# Patient Record
Sex: Male | Born: 1975 | Race: White | Hispanic: No | Marital: Single | Smoking: Never smoker
Health system: Southern US, Community
[De-identification: ages and names within clinical notes are randomized; demographics above are authoritative.]

## PROBLEM LIST (undated history)

## (undated) DIAGNOSIS — R6251 Failure to thrive (child): Secondary | ICD-10-CM

## (undated) DIAGNOSIS — G969 Disorder of central nervous system, unspecified: Secondary | ICD-10-CM

## (undated) DIAGNOSIS — M62838 Other muscle spasm: Secondary | ICD-10-CM

## (undated) DIAGNOSIS — I8390 Asymptomatic varicose veins of unspecified lower extremity: Secondary | ICD-10-CM

## (undated) DIAGNOSIS — G809 Cerebral palsy, unspecified: Secondary | ICD-10-CM

## (undated) DIAGNOSIS — K52832 Lymphocytic colitis: Secondary | ICD-10-CM

## (undated) DIAGNOSIS — G808 Other cerebral palsy: Secondary | ICD-10-CM

## (undated) DIAGNOSIS — K219 Gastro-esophageal reflux disease without esophagitis: Secondary | ICD-10-CM

## (undated) DIAGNOSIS — M2450 Contracture, unspecified joint: Secondary | ICD-10-CM

## (undated) DIAGNOSIS — R197 Diarrhea, unspecified: Secondary | ICD-10-CM

## (undated) HISTORY — DX: Lymphocytic colitis: K52.832

## (undated) HISTORY — DX: Gastro-esophageal reflux disease without esophagitis: K21.9

## (undated) HISTORY — DX: Cerebral palsy, unspecified: G80.9

## (undated) HISTORY — DX: Failure to thrive (child): R62.51

## (undated) HISTORY — DX: Asymptomatic varicose veins of unspecified lower extremity: I83.90

## (undated) HISTORY — DX: Disorder of central nervous system, unspecified: G96.9

## (undated) HISTORY — DX: Contracture, unspecified joint: M24.50

---

## 2004-04-15 HISTORY — PX: COLONOSCOPY: SHX5424

## 2004-12-21 ENCOUNTER — Encounter (INDEPENDENT_AMBULATORY_CARE_PROVIDER_SITE_OTHER): Payer: Self-pay | Admitting: Specialist

## 2004-12-21 ENCOUNTER — Ambulatory Visit (HOSPITAL_COMMUNITY): Admission: RE | Admit: 2004-12-21 | Discharge: 2004-12-21 | Payer: Self-pay | Admitting: Gastroenterology

## 2004-12-21 DIAGNOSIS — K52832 Lymphocytic colitis: Secondary | ICD-10-CM

## 2004-12-21 HISTORY — DX: Lymphocytic colitis: K52.832

## 2005-10-17 ENCOUNTER — Ambulatory Visit: Payer: Self-pay | Admitting: Family Medicine

## 2005-12-13 ENCOUNTER — Encounter (INDEPENDENT_AMBULATORY_CARE_PROVIDER_SITE_OTHER): Payer: Self-pay | Admitting: Family Medicine

## 2005-12-13 ENCOUNTER — Ambulatory Visit: Payer: Self-pay | Admitting: Family Medicine

## 2006-02-05 ENCOUNTER — Ambulatory Visit: Payer: Self-pay | Admitting: Family Medicine

## 2006-04-28 ENCOUNTER — Ambulatory Visit: Payer: Self-pay | Admitting: Family Medicine

## 2006-12-04 ENCOUNTER — Encounter (INDEPENDENT_AMBULATORY_CARE_PROVIDER_SITE_OTHER): Payer: Self-pay | Admitting: Family Medicine

## 2006-12-04 DIAGNOSIS — G809 Cerebral palsy, unspecified: Secondary | ICD-10-CM | POA: Insufficient documentation

## 2006-12-16 DIAGNOSIS — G8250 Quadriplegia, unspecified: Secondary | ICD-10-CM

## 2007-01-08 ENCOUNTER — Encounter (INDEPENDENT_AMBULATORY_CARE_PROVIDER_SITE_OTHER): Payer: Self-pay | Admitting: Family Medicine

## 2007-02-05 ENCOUNTER — Encounter (INDEPENDENT_AMBULATORY_CARE_PROVIDER_SITE_OTHER): Payer: Self-pay | Admitting: Family Medicine

## 2007-02-16 ENCOUNTER — Ambulatory Visit: Payer: Self-pay | Admitting: Family Medicine

## 2007-02-16 DIAGNOSIS — R636 Underweight: Secondary | ICD-10-CM | POA: Insufficient documentation

## 2007-02-16 LAB — CONVERTED CEMR LAB
ALT: 18 units/L (ref 0–53)
AST: 21 units/L (ref 0–37)
Albumin: 4.4 g/dL (ref 3.5–5.2)
Alkaline Phosphatase: 66 units/L (ref 39–117)
Basophils Absolute: 0 10*3/uL (ref 0.0–0.1)
Basophils Relative: 1 % (ref 0–1)
Calcium: 9.4 mg/dL (ref 8.4–10.5)
Chloride: 104 meq/L (ref 96–112)
Cholesterol: 128 mg/dL (ref 0–200)
Creatinine, Ser: 0.87 mg/dL (ref 0.40–1.50)
Eosinophils Absolute: 0.2 10*3/uL (ref 0.0–0.7)
Eosinophils Relative: 3 % (ref 0–5)
HDL: 38 mg/dL — ABNORMAL LOW (ref >39)
Hemoglobin: 14.5 g/dL (ref 13.0–17.0)
MCHC: 32.8 g/dL (ref 30.0–36.0)
Monocytes Absolute: 0.5 10*3/uL (ref 0.2–0.7)
Neutro Abs: 3.7 10*3/uL (ref 1.7–7.7)
RDW: 13.1 % (ref 11.5–14.0)
Total Protein: 7.4 g/dL (ref 6.0–8.3)
Triglycerides: 66 mg/dL (ref <150)

## 2007-03-09 ENCOUNTER — Encounter: Admission: RE | Admit: 2007-03-09 | Discharge: 2007-03-09 | Payer: Self-pay | Admitting: Family Medicine

## 2007-03-09 ENCOUNTER — Encounter (INDEPENDENT_AMBULATORY_CARE_PROVIDER_SITE_OTHER): Payer: Self-pay | Admitting: Family Medicine

## 2007-03-19 ENCOUNTER — Encounter (INDEPENDENT_AMBULATORY_CARE_PROVIDER_SITE_OTHER): Payer: Self-pay | Admitting: Family Medicine

## 2007-03-23 ENCOUNTER — Telehealth (INDEPENDENT_AMBULATORY_CARE_PROVIDER_SITE_OTHER): Payer: Self-pay | Admitting: *Deleted

## 2007-04-13 ENCOUNTER — Encounter (INDEPENDENT_AMBULATORY_CARE_PROVIDER_SITE_OTHER): Payer: Self-pay | Admitting: Family Medicine

## 2007-04-24 ENCOUNTER — Encounter (INDEPENDENT_AMBULATORY_CARE_PROVIDER_SITE_OTHER): Payer: Self-pay | Admitting: Family Medicine

## 2007-05-08 ENCOUNTER — Encounter (INDEPENDENT_AMBULATORY_CARE_PROVIDER_SITE_OTHER): Payer: Self-pay | Admitting: Family Medicine

## 2007-05-13 ENCOUNTER — Encounter (INDEPENDENT_AMBULATORY_CARE_PROVIDER_SITE_OTHER): Payer: Self-pay | Admitting: Family Medicine

## 2007-05-25 ENCOUNTER — Encounter (INDEPENDENT_AMBULATORY_CARE_PROVIDER_SITE_OTHER): Payer: Self-pay | Admitting: Family Medicine

## 2007-06-18 ENCOUNTER — Encounter (INDEPENDENT_AMBULATORY_CARE_PROVIDER_SITE_OTHER): Payer: Self-pay | Admitting: Family Medicine

## 2007-06-23 ENCOUNTER — Telehealth (INDEPENDENT_AMBULATORY_CARE_PROVIDER_SITE_OTHER): Payer: Self-pay | Admitting: Family Medicine

## 2007-07-07 ENCOUNTER — Encounter (INDEPENDENT_AMBULATORY_CARE_PROVIDER_SITE_OTHER): Payer: Self-pay | Admitting: Family Medicine

## 2007-07-17 ENCOUNTER — Encounter (INDEPENDENT_AMBULATORY_CARE_PROVIDER_SITE_OTHER): Payer: Self-pay | Admitting: Family Medicine

## 2007-08-11 ENCOUNTER — Encounter (INDEPENDENT_AMBULATORY_CARE_PROVIDER_SITE_OTHER): Payer: Self-pay | Admitting: Family Medicine

## 2007-08-26 ENCOUNTER — Encounter (INDEPENDENT_AMBULATORY_CARE_PROVIDER_SITE_OTHER): Payer: Self-pay | Admitting: Family Medicine

## 2007-09-11 ENCOUNTER — Encounter (INDEPENDENT_AMBULATORY_CARE_PROVIDER_SITE_OTHER): Payer: Self-pay | Admitting: Family Medicine

## 2007-09-17 ENCOUNTER — Encounter (INDEPENDENT_AMBULATORY_CARE_PROVIDER_SITE_OTHER): Payer: Self-pay | Admitting: Family Medicine

## 2007-09-17 ENCOUNTER — Telehealth (INDEPENDENT_AMBULATORY_CARE_PROVIDER_SITE_OTHER): Payer: Self-pay | Admitting: Family Medicine

## 2007-10-17 ENCOUNTER — Encounter (INDEPENDENT_AMBULATORY_CARE_PROVIDER_SITE_OTHER): Payer: Self-pay | Admitting: Family Medicine

## 2007-10-26 ENCOUNTER — Encounter (INDEPENDENT_AMBULATORY_CARE_PROVIDER_SITE_OTHER): Payer: Self-pay | Admitting: Family Medicine

## 2007-10-26 ENCOUNTER — Emergency Department (HOSPITAL_COMMUNITY): Admission: EM | Admit: 2007-10-26 | Discharge: 2007-10-26 | Payer: Self-pay | Admitting: Emergency Medicine

## 2007-10-26 ENCOUNTER — Telehealth (INDEPENDENT_AMBULATORY_CARE_PROVIDER_SITE_OTHER): Payer: Self-pay | Admitting: Family Medicine

## 2007-10-30 ENCOUNTER — Encounter (INDEPENDENT_AMBULATORY_CARE_PROVIDER_SITE_OTHER): Payer: Self-pay | Admitting: Internal Medicine

## 2007-11-05 ENCOUNTER — Encounter (INDEPENDENT_AMBULATORY_CARE_PROVIDER_SITE_OTHER): Payer: Self-pay | Admitting: Family Medicine

## 2007-11-09 ENCOUNTER — Telehealth (INDEPENDENT_AMBULATORY_CARE_PROVIDER_SITE_OTHER): Payer: Self-pay | Admitting: Family Medicine

## 2007-11-09 ENCOUNTER — Ambulatory Visit: Payer: Self-pay | Admitting: Family Medicine

## 2007-11-13 ENCOUNTER — Encounter (INDEPENDENT_AMBULATORY_CARE_PROVIDER_SITE_OTHER): Payer: Self-pay | Admitting: Family Medicine

## 2007-11-30 ENCOUNTER — Encounter: Admission: RE | Admit: 2007-11-30 | Discharge: 2008-01-13 | Payer: Self-pay | Admitting: Family Medicine

## 2007-12-06 ENCOUNTER — Emergency Department (HOSPITAL_COMMUNITY): Admission: EM | Admit: 2007-12-06 | Discharge: 2007-12-06 | Payer: Self-pay | Admitting: Emergency Medicine

## 2007-12-09 ENCOUNTER — Telehealth (INDEPENDENT_AMBULATORY_CARE_PROVIDER_SITE_OTHER): Payer: Self-pay | Admitting: *Deleted

## 2007-12-09 ENCOUNTER — Encounter (INDEPENDENT_AMBULATORY_CARE_PROVIDER_SITE_OTHER): Payer: Self-pay | Admitting: Family Medicine

## 2008-01-14 ENCOUNTER — Encounter (INDEPENDENT_AMBULATORY_CARE_PROVIDER_SITE_OTHER): Payer: Self-pay | Admitting: Family Medicine

## 2008-01-19 ENCOUNTER — Encounter (INDEPENDENT_AMBULATORY_CARE_PROVIDER_SITE_OTHER): Payer: Self-pay | Admitting: Family Medicine

## 2008-01-27 ENCOUNTER — Ambulatory Visit: Payer: Self-pay | Admitting: Family Medicine

## 2008-01-29 ENCOUNTER — Encounter (INDEPENDENT_AMBULATORY_CARE_PROVIDER_SITE_OTHER): Payer: Self-pay | Admitting: Family Medicine

## 2008-03-17 ENCOUNTER — Encounter (INDEPENDENT_AMBULATORY_CARE_PROVIDER_SITE_OTHER): Payer: Self-pay | Admitting: Family Medicine

## 2008-04-01 ENCOUNTER — Telehealth (INDEPENDENT_AMBULATORY_CARE_PROVIDER_SITE_OTHER): Payer: Self-pay | Admitting: *Deleted

## 2008-04-11 ENCOUNTER — Encounter (INDEPENDENT_AMBULATORY_CARE_PROVIDER_SITE_OTHER): Payer: Self-pay | Admitting: Family Medicine

## 2008-05-04 ENCOUNTER — Encounter (INDEPENDENT_AMBULATORY_CARE_PROVIDER_SITE_OTHER): Payer: Self-pay | Admitting: Family Medicine

## 2008-05-10 ENCOUNTER — Ambulatory Visit: Payer: Self-pay | Admitting: Family Medicine

## 2008-05-12 ENCOUNTER — Encounter (INDEPENDENT_AMBULATORY_CARE_PROVIDER_SITE_OTHER): Payer: Self-pay | Admitting: Family Medicine

## 2008-06-30 ENCOUNTER — Telehealth (INDEPENDENT_AMBULATORY_CARE_PROVIDER_SITE_OTHER): Payer: Self-pay | Admitting: *Deleted

## 2008-07-06 ENCOUNTER — Encounter (INDEPENDENT_AMBULATORY_CARE_PROVIDER_SITE_OTHER): Payer: Self-pay | Admitting: Family Medicine

## 2008-07-06 ENCOUNTER — Encounter (INDEPENDENT_AMBULATORY_CARE_PROVIDER_SITE_OTHER): Payer: Self-pay | Admitting: Nurse Practitioner

## 2008-07-13 ENCOUNTER — Encounter (INDEPENDENT_AMBULATORY_CARE_PROVIDER_SITE_OTHER): Payer: Self-pay | Admitting: Nurse Practitioner

## 2008-08-13 ENCOUNTER — Encounter (INDEPENDENT_AMBULATORY_CARE_PROVIDER_SITE_OTHER): Payer: Self-pay | Admitting: Family Medicine

## 2008-08-16 ENCOUNTER — Ambulatory Visit: Payer: Self-pay | Admitting: Family Medicine

## 2008-08-16 DIAGNOSIS — B029 Zoster without complications: Secondary | ICD-10-CM | POA: Insufficient documentation

## 2008-09-13 ENCOUNTER — Encounter (INDEPENDENT_AMBULATORY_CARE_PROVIDER_SITE_OTHER): Payer: Self-pay | Admitting: Family Medicine

## 2008-10-31 ENCOUNTER — Encounter (INDEPENDENT_AMBULATORY_CARE_PROVIDER_SITE_OTHER): Payer: Self-pay | Admitting: Internal Medicine

## 2008-11-07 ENCOUNTER — Encounter (INDEPENDENT_AMBULATORY_CARE_PROVIDER_SITE_OTHER): Payer: Self-pay | Admitting: Internal Medicine

## 2008-11-18 ENCOUNTER — Telehealth (INDEPENDENT_AMBULATORY_CARE_PROVIDER_SITE_OTHER): Payer: Self-pay | Admitting: Internal Medicine

## 2008-12-12 ENCOUNTER — Ambulatory Visit: Payer: Self-pay | Admitting: Family Medicine

## 2009-01-11 ENCOUNTER — Encounter: Payer: Self-pay | Admitting: Physician Assistant

## 2009-01-26 ENCOUNTER — Ambulatory Visit: Payer: Self-pay | Admitting: Nurse Practitioner

## 2009-03-31 ENCOUNTER — Ambulatory Visit: Payer: Self-pay | Admitting: Physician Assistant

## 2009-04-03 ENCOUNTER — Encounter: Payer: Self-pay | Admitting: Physician Assistant

## 2009-04-27 ENCOUNTER — Encounter: Payer: Self-pay | Admitting: Physician Assistant

## 2009-05-10 ENCOUNTER — Ambulatory Visit: Payer: Self-pay | Admitting: Physician Assistant

## 2009-05-10 ENCOUNTER — Telehealth: Payer: Self-pay | Admitting: Physician Assistant

## 2009-05-11 ENCOUNTER — Encounter: Payer: Self-pay | Admitting: Physician Assistant

## 2009-05-17 ENCOUNTER — Telehealth: Payer: Self-pay | Admitting: Physician Assistant

## 2009-05-19 ENCOUNTER — Encounter: Admission: RE | Admit: 2009-05-19 | Discharge: 2009-07-03 | Payer: Self-pay | Admitting: Physician Assistant

## 2009-05-19 ENCOUNTER — Encounter: Payer: Self-pay | Admitting: Physician Assistant

## 2009-06-05 ENCOUNTER — Encounter: Payer: Self-pay | Admitting: Physician Assistant

## 2009-07-03 ENCOUNTER — Encounter: Payer: Self-pay | Admitting: Physician Assistant

## 2009-07-03 ENCOUNTER — Telehealth: Payer: Self-pay | Admitting: Physician Assistant

## 2009-07-11 ENCOUNTER — Telehealth: Payer: Self-pay | Admitting: Physician Assistant

## 2009-07-13 ENCOUNTER — Encounter: Payer: Self-pay | Admitting: Physician Assistant

## 2009-07-21 ENCOUNTER — Telehealth: Payer: Self-pay | Admitting: Physician Assistant

## 2009-07-28 ENCOUNTER — Encounter: Payer: Self-pay | Admitting: Physician Assistant

## 2009-07-31 IMAGING — CR DG FOOT COMPLETE 3+V*L*
3 series · 3 of 3 positions shown · non-contrast
Comparison: None

CLINICAL DATA: Trauma.  Left great toe hurts.  Patient with
physical handicap.  Unable to hold legs still due to spasms.

LEFT FOOT - COMPLETE 3+ VIEW

[view not recorded (1 of 3)]
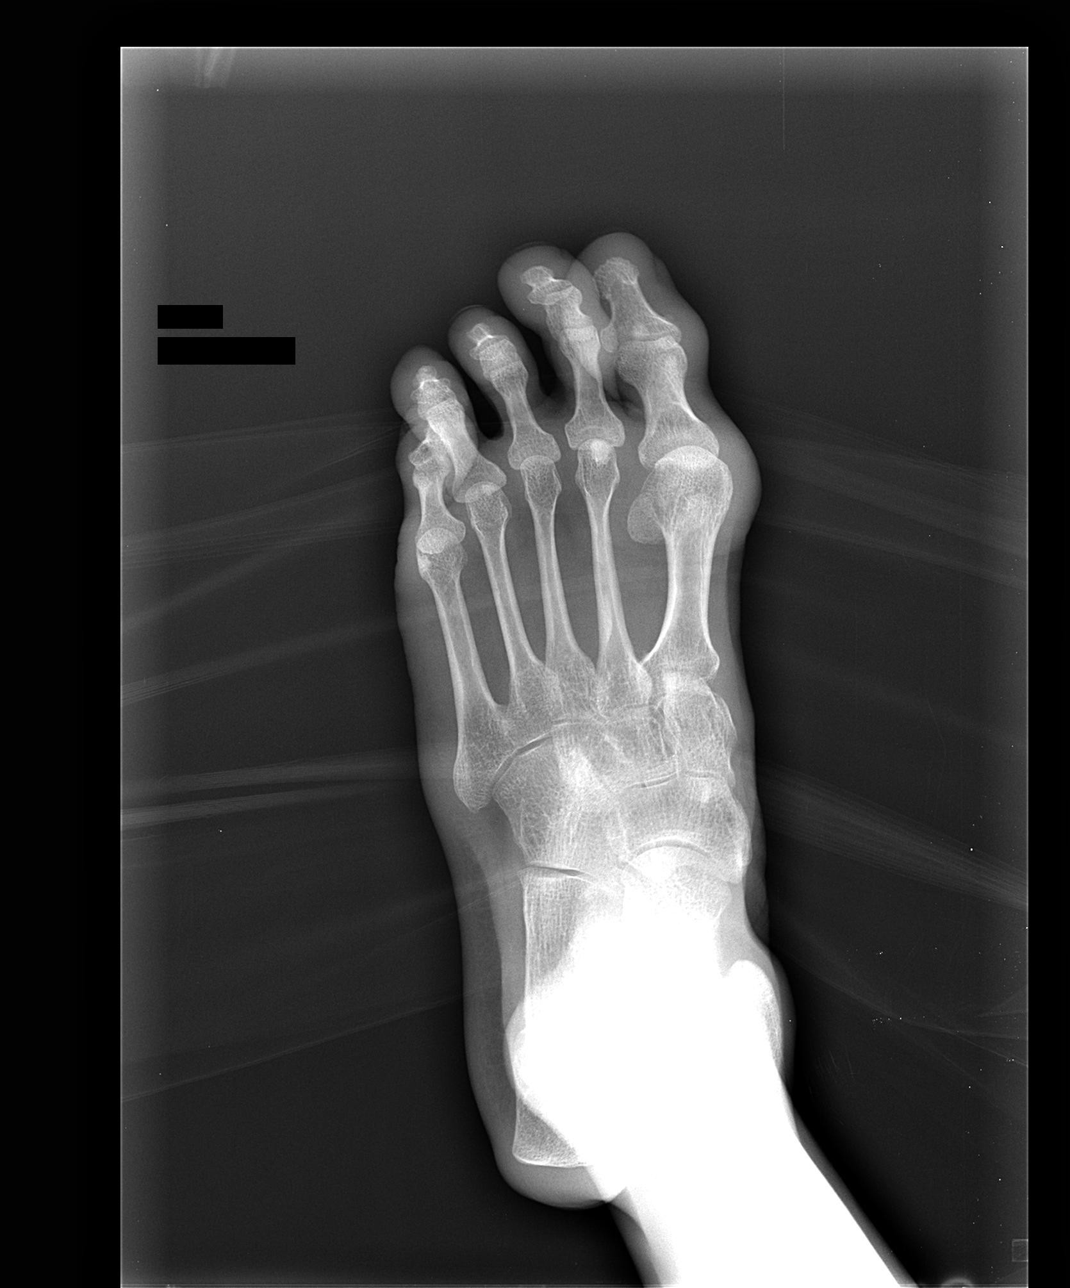

[view not recorded (2 of 3)]
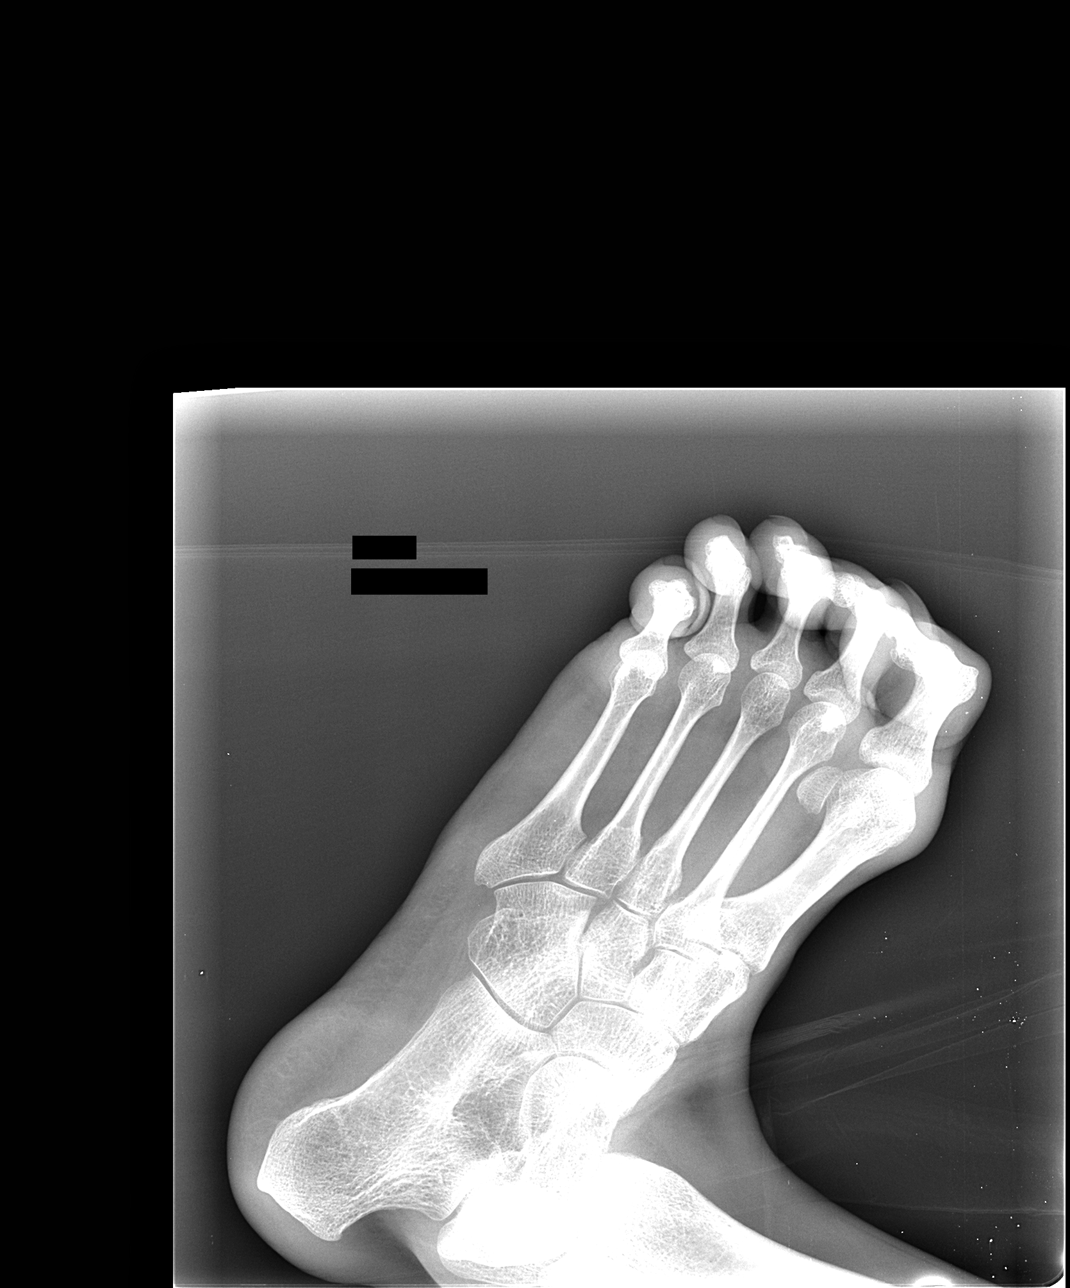

[view not recorded (3 of 3)]
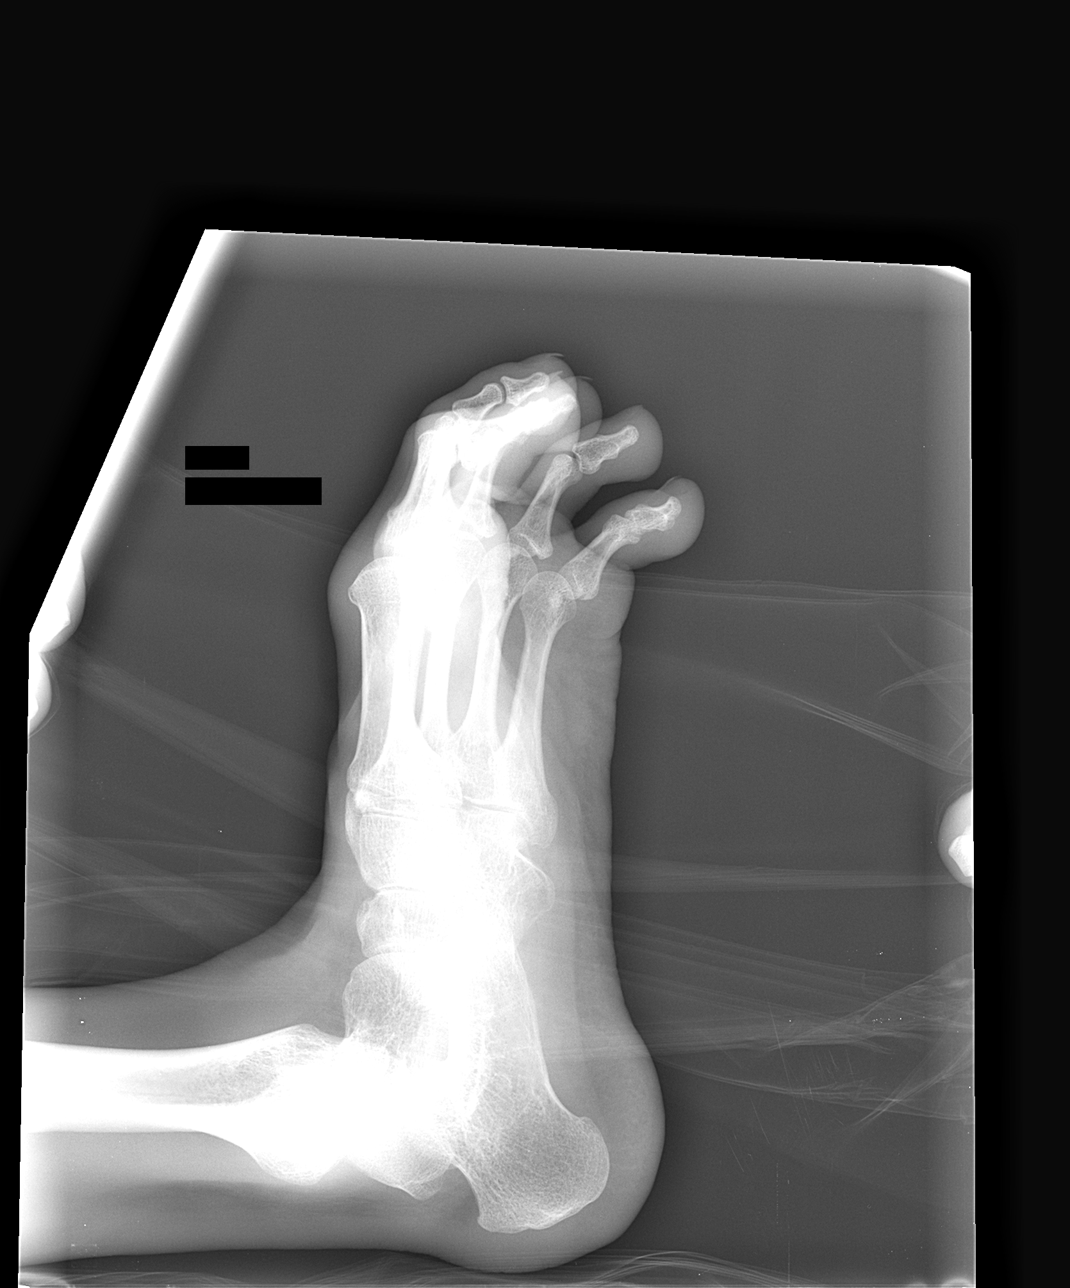

[3 of 3 positions shown; findings below may reference images not displayed]

FINDINGS: Exam is limited by suboptimal patient positioning.
Specifically,  the MTP joints are flexed.  There is a probable
hallux valgus deformity.  Mild osteopenia.  Probable pes planus
deformity.
IMPRESSION: 1.  Limited exam due to suboptimal patient positioning as
described.
2.  Given this limitation, no acute findings about the left foot.

## 2009-08-23 ENCOUNTER — Encounter: Payer: Self-pay | Admitting: Physician Assistant

## 2009-08-24 ENCOUNTER — Telehealth: Payer: Self-pay | Admitting: Physician Assistant

## 2009-08-28 ENCOUNTER — Encounter: Payer: Self-pay | Admitting: Physician Assistant

## 2009-09-05 ENCOUNTER — Encounter: Payer: Self-pay | Admitting: Physician Assistant

## 2009-09-19 ENCOUNTER — Telehealth: Payer: Self-pay | Admitting: Physician Assistant

## 2009-09-25 ENCOUNTER — Telehealth: Payer: Self-pay | Admitting: Physician Assistant

## 2009-09-25 ENCOUNTER — Ambulatory Visit: Payer: Self-pay | Admitting: Physician Assistant

## 2009-09-26 ENCOUNTER — Telehealth: Payer: Self-pay | Admitting: Physician Assistant

## 2009-09-27 ENCOUNTER — Encounter: Payer: Self-pay | Admitting: Physician Assistant

## 2009-09-29 ENCOUNTER — Encounter: Payer: Self-pay | Admitting: Physician Assistant

## 2009-10-18 ENCOUNTER — Ambulatory Visit: Payer: Self-pay | Admitting: Physician Assistant

## 2009-10-19 ENCOUNTER — Telehealth: Payer: Self-pay | Admitting: Physician Assistant

## 2009-11-14 ENCOUNTER — Encounter: Payer: Self-pay | Admitting: Physician Assistant

## 2009-11-20 ENCOUNTER — Telehealth: Payer: Self-pay | Admitting: Physician Assistant

## 2009-11-23 ENCOUNTER — Encounter: Payer: Self-pay | Admitting: Physician Assistant

## 2009-12-01 ENCOUNTER — Encounter: Payer: Self-pay | Admitting: Physician Assistant

## 2009-12-25 ENCOUNTER — Encounter: Payer: Self-pay | Admitting: Physician Assistant

## 2010-01-08 ENCOUNTER — Encounter: Payer: Self-pay | Admitting: Physician Assistant

## 2010-02-17 ENCOUNTER — Telehealth (INDEPENDENT_AMBULATORY_CARE_PROVIDER_SITE_OTHER): Payer: Self-pay | Admitting: Internal Medicine

## 2010-02-19 ENCOUNTER — Encounter: Payer: Self-pay | Admitting: Physician Assistant

## 2010-02-20 ENCOUNTER — Telehealth (INDEPENDENT_AMBULATORY_CARE_PROVIDER_SITE_OTHER): Payer: Self-pay | Admitting: Internal Medicine

## 2010-03-06 ENCOUNTER — Emergency Department (HOSPITAL_COMMUNITY): Admission: EM | Admit: 2010-03-06 | Discharge: 2010-03-06 | Payer: Self-pay | Admitting: Emergency Medicine

## 2010-03-19 ENCOUNTER — Encounter (INDEPENDENT_AMBULATORY_CARE_PROVIDER_SITE_OTHER): Payer: Self-pay | Admitting: Internal Medicine

## 2010-04-10 ENCOUNTER — Encounter (INDEPENDENT_AMBULATORY_CARE_PROVIDER_SITE_OTHER): Payer: Self-pay | Admitting: Nurse Practitioner

## 2010-04-17 ENCOUNTER — Encounter: Payer: Self-pay | Admitting: Physician Assistant

## 2010-05-04 ENCOUNTER — Encounter (INDEPENDENT_AMBULATORY_CARE_PROVIDER_SITE_OTHER): Payer: Self-pay | Admitting: Internal Medicine

## 2010-05-15 NOTE — Assessment & Plan Note (Signed)
Summary: CPE/////KT   Vital Signs:  Patient profile:   35 year old male Weight:      91 pounds Temp:     98.7 degrees F oral Pulse rate:   72 / minute Pulse rhythm:   regular Resp:     18 per minute BP sitting:   109 / 77  (left arm) Cuff size:   regular  Vitals Entered By: Armenia Shannon (May 10, 2009 10:11 AM)  Primary Care Provider:  Tereso Newcomer PA-C   History of Present Illness: Here for CPE. Arrives in power wheelchair.  Unable to get him on exam table with 2 assistants and hoyer lift.  His spasticity limits moving him and we had to keep him in the chair with his clothes on to do the exam. He thinks his spasticity is worse and requests to go back to therapy. He has bouts of unexplained diarrhea.  He had a colo in 2006 that was normal.  Asacol is on his med list.  He notes some abdominal cramping and diarrhea.  NO vomiting.  No fevers.  He is passing flatus.  He denies any blood in his stools or melena. He states that this is similar to previous episodes without change and he can take immodium if he needs to.  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 Penicillin V Potassium (Penicillin V Potassium)  Past History:  Past Medical History: Last updated: 12/04/2006 Current Problems:  DIARRHEA, CHRONIC (ICD-787.91) CEREBRAL PALSY (ICD-343.9)  Social History: lives at [facility]  Review of Systems  The patient denies fever, chest pain, melena, hematochezia, and severe indigestion/heartburn.    Physical Exam  General:  NAD arrives in power wheelchair has CP and has spasticity and speech difficulties Head:  normocephalic and atraumatic.   Eyes:  pupils equal, pupils round, pupils reactive to  light, and no injection.   Ears:  R ear normal and L ear normal.   Nose:  no external deformity.   Mouth:  pharynx pink and moist, no erythema, and no exudates.   Neck:  supple and no thyromegaly.   Lungs:  normal breath sounds, no crackles, and no wheezes.   Heart:  normal rate and regular rhythm.   Abdomen:  soft, normal bowel sounds, and no hepatomegaly.   Rectal:  deferred Genitalia:  circumcised, no scrotal masses, and no testicular masses or atrophy.   Msk:  spasticity of all extremities limits exam he had to be kept strapped in his wheelchair for the exam Extremities:  no edema Neurologic:  alert & oriented X3.   Skin:  surveyed most of his skin except his buttocks and feet due to difficulties in maneuvering him no gross lesions noted  Psych:  normally interactive.     Impression & Recommendations:  Problem # 1:  CEREBRAL PALSY (ICD-343.9)  refer back to PT/OT  Orders: Physical Therapy Referral (PT)  Problem # 2:  DIARRHEA, CHRONIC (ICD-787.91) notify me if he has any changes he needs an alt to metamucil due to cost . . . will check on this  His updated medication list for this problem includes:    Lomotil 2.5-0.025 Mg Tabs (Diphenoxylate-atropine) .Marland Kitchen... 2 tabs by mouth q6hrs as needed diarrhea  Problem # 3:  Preventive Health Care (ICD-V70.0) flu shot done earlier this season Td up to date chol ok in 2008  Complete Medication List: 1)  Therapeutic Multivitamin Tabs (Multiple vitamin) .Marland Kitchen.. 1 by mouth qday 2)  Baclofen 20 Mg Tabs (Baclofen) .Marland Kitchen.. 1 tab by 4 times daily 3)  Valium 5 Mg Tabs (Diazepam) .Marland Kitchen.. 1 by mouth at bedtime and q 12hrs as needed extra muscle spasms 4)  Lomotil 2.5-0.025 Mg Tabs (Diphenoxylate-atropine) .... 2 tabs by mouth q6hrs as needed diarrhea 5)  Transderm-scop 1.5 Mg Pt72 (Scopolamine base) .... Apply 1 patch daily every 3 days 6)  Folic Acid 1 Mg Tabs (Folic acid) .Marland Kitchen.. 1 by mouth qday 7)  Asacol 400 Mg Tbec (Mesalamine) .... 2 tabs by  mouth tid 8)  Genfiber 50 % Powd (Psyllium) .... Mix 1 tablespoon in 8 oz of liquid and take by mouth bid 9)  Tizanidine Hcl 4 Mg Tabs (Tizanidine hcl) .... Take 2 tablets by mouth qid(per dr.weymann) 10)  Valtrex 1 Gm Tabs (Valacyclovir hcl) .... Take 1 tablet by mouth every 8 hours x 7 days  Patient Instructions: 1)  Please schedule a follow-up appointment in 1 year.

## 2010-05-15 NOTE — Progress Notes (Signed)
Summary: FAX RX TO APRIL [facility]  Phone Note Refill Request   Refills Requested: Medication #1:  VALIUM 10 MG TABS ONE TAB AT BEDTIME  Medication #2:  BACLOFEN 20 MG  TABS 1 tab by 4 times daily PLEASE FAX THE RX'S TO APRIL FROM [facility] FAX # [phone]  Initial call taken by: Domenic Polite,  February 20, 2010 2:31 PM  Follow-up for Phone Call        I see the Baclofen in his MAR in past, but not ordered from his chart--please confirm with [facility] that he is taking both Valium and Baclofen--why is Baclofen being filled separate from signature on [facility] this time around? Follow-up by: Julieanne Manson MD,  February 21, 2010 12:58 PM  Additional Follow-up for Phone Call Additional follow up Details #1::        He is still taking both meds per A. Anselm Lis at [facility].   Rx faxed to pharmacy.  Dutch Quint RN  February 21, 2010 3:58 PM     Additional Follow-up for Phone Call Additional follow up Details #2::    Please fax Baclofen as well. Follow-up by: Julieanne Manson MD,  February 22, 2010 8:52 AM  Prescriptions: BACLOFEN 20 MG  TABS (BACLOFEN) 1 tab by 4 times daily  #120 x 3   Entered and Authorized by:   Julieanne Manson MD   Signed by:   Julieanne Manson MD on 02/22/2010   Method used:   Printed then faxed to ...       [facility] [address] (mail-order)       [address] [place], [place]  [address]       Ph: [phone]       Fax: [phone]   RxID:   [id number]

## 2010-05-15 NOTE — Letter (Signed)
Summary: TEST ORDER FORM//DURABLE MEDICAL EQUIPMENT  TEST ORDER FORM//DURABLE MEDICAL EQUIPMENT   Imported By: Arta Bruce 09/14/2009 12:41:28  _____________________________________________________________________  External Attachment:    Type:   Image     Comment:   External Document

## 2010-05-15 NOTE — Letter (Signed)
Summary[facility]   Imported By: Arta Bruce 06/24/2007 15:12:06  _____________________________________________________________________  External Attachment:    Type:   Image     Comment:   External Document

## 2010-05-15 NOTE — Letter (Signed)
Summary: CHAIR & EQUIPMENT//MAILED  CHAIR & EQUIPMENT//MAILED   Imported By: Arta Bruce 08/28/2009 11:21:12  _____________________________________________________________________  External Attachment:    Type:   Image     Comment:   External Document

## 2010-05-15 NOTE — Letter (Signed)
Summary: HOME HEALTH//PLAN OF CARE 04/30/09 TO 07/08/09  HOME HEALTH//PLAN OF CARE 04/30/09 TO 07/08/09   Imported By: Silvio Pate Stanislawscyk 06/22/2009 14:28:12  _____________________________________________________________________  External Attachment:    Type:   Image     Comment:   External Document

## 2010-05-15 NOTE — Progress Notes (Signed)
Summary: MISSED HIS AFTERNOON MEDICATION  Phone Note Call from Patient Call back at Home Phone [phone]   Caller: LISA [facility] Summary of Call: Zaineb Nowaczyk PT. LISA FROM [facility] CALLED AND SAYS THAT Mustaf MISS HIS AFTERNOON MEDICATION WOULD IT BOTHER HIM TO TAKE IT NOW. HE MISSED HIS ASACOL, BACLOFEN, AND TIZANDINE Initial call taken by: Leodis Rains,  July 11, 2009 2:23 PM  Follow-up for Phone Call        Lorin Picket will this be a problem?? provider will need to review.... Follow-up by: Mikey College CMA,  July 12, 2009 9:37 AM  Additional Follow-up for Phone Call Additional follow up Details #1::        Just take it at the next scheduled time. Do not double up. Additional Follow-up by: Tereso Newcomer PA-C,  July 12, 2009 11:06 AM    Additional Follow-up for Phone Call Additional follow up Details #2::    tried calling pt but no answer .Marland Kitchen.faxed rx of valium..Armenia Shannon  July 17, 2009 5:30 PM   Britta Mccreedy is aware.... Armenia Shannon  July 18, 2009 9:38 AM

## 2010-05-15 NOTE — Medication Information (Signed)
Summary: NON-COVERED MEDICATION  NON-COVERED MEDICATION   Imported By: Arta Bruce 11/23/2009 15:07:46  _____________________________________________________________________  External Attachment:    Type:   Image     Comment:   External Document

## 2010-05-15 NOTE — Progress Notes (Signed)
Summary: Needs Diazepam refilled  Phone Note Call from Patient   Summary of Call: NEEDS DIAZEPAM REFILL//[facility] FAX [phone]  [phone] Initial call taken by: Arta Bruce,  September 19, 2009 10:51 AM  Follow-up for Phone Call        Sent to [facility].  Dutch Quint RN  September 19, 2009 11:55 AM   Additional Follow-up for Phone Call Additional follow up Details #1::        In [place]'s basket to fax to pharmacy. Additional Follow-up by: Tereso Newcomer PA-C,  September 19, 2009 1:48 PM    Additional Follow-up for Phone Call Additional follow up Details #2::    Rx faxed -- Claris Che at [facility] notified.  Dutch Quint RN  September 19, 2009 2:19 PM  Follow-up by: Dutch Quint RN,  September 19, 2009 2:20 PM  Prescriptions: VALIUM 10 MG TABS (DIAZEPAM) ONE TAB AT BEDTIME  #30 x 0   Entered and Authorized by:   Tereso Newcomer PA-C   Signed by:   Tereso Newcomer PA-C on 09/19/2009   Method used:   Printed then faxed to ...       [facility] [address] (mail-order)       [address] [place], [place]  [address]       Ph: [phone]       Fax: [phone]   RxID:   [id number]

## 2010-05-15 NOTE — Progress Notes (Signed)
Summary: coming pus under his bottom toe  Phone Note Call from Patient Call back at Home Phone [phone] Call back at [phone]   Summary of Call: [facility] called in on behalf the pt above because the pt has a pus coming underneath his big toe.  The only thing they have done is to keeping clean and putting antibiotic.   Alben Spittle PA-c  Initial call taken by: Manon Hilding,  September 25, 2009 10:49 AM  Follow-up for Phone Call        Spoke with Claris Che at [facility] -- toe has small amount of pus draining -- appt. made for nurse visit today.  Dutch Quint RN  September 25, 2009 12:55 PM  Follow-up by: Dutch Quint RN,  September 25, 2009 12:55 PM

## 2010-05-15 NOTE — Miscellaneous (Signed)
Summary: PLAN OF CARE 02/24/10 TO 04/24/10  PLAN OF CARE 02/24/10 TO 04/24/10   Imported By: Silvio Pate Stanislawscyk 03/19/2010 09:26:00  _____________________________________________________________________  External Attachment:    Type:   Image     Comment:   External Document

## 2010-05-15 NOTE — Progress Notes (Signed)
  Phone Note Call from Patient Call back at Home Phone [phone]   Summary of Call: Claris Che from [facility] wants you send a DC order to her fax number [phone] (attention to Claris Che) Alben Spittle PA-C Initial call taken by: Manon Hilding,  May 17, 2009 10:40 AM  Follow-up for Phone Call        spoke with margaret at [facility] and she would like to know if you wanted Mr. Darko taking two vitamins:  Therapeutic vitamins and the centrum kids chewables .04mg ... Claris Che says pt has been on the chewables for a while now and she wanted to know should he be on both or just one and which one would you prefer.... Claris Che would also like to know if you wanted to D/C the valtrex since that was a one time thing... Claris Che says if our med list is correct and update she takes them to the pharmacy and they go off our med list to give pt what he needs Follow-up by: Armenia Shannon,  May 18, 2009 9:53 AM  Additional Follow-up for Phone Call Additional follow up Details #1::        spoke with margaret and she said she found d/c order for the chewables......Marland KitchenArmenia Shannon  May 18, 2009 11:12 AM        Current Medications (verified): 1)  Therapeutic Multivitamin   Tabs (Multiple Vitamin) .Marland Kitchen.. 1 By Mouth Qday 2)  Baclofen 20 Mg  Tabs (Baclofen) .Marland Kitchen.. 1 Tab By 4 Times Daily 3)  Valium 10 Mg Tabs (Diazepam) .... One Tab At Bedtime 4)  Folic Acid 1 Mg  Tabs (Folic Acid) .Marland Kitchen.. 1 By Mouth Qday 5)  Asacol 400 Mg  Tbec (Mesalamine) .... 2 Tabs By Mouth Tid 6)  Natural Fiber 48.57 % Powd (Psyllium) .... Mix in Water and Drink One Glass Two Times A Day or As Directed 7)  Tizanidine Hcl 4 Mg Tabs (Tizanidine Hcl) .... Two Tabs By By Mouth Qid  Allergies: 1)  ! Penicillin V Potassium (Penicillin V Potassium)

## 2010-05-15 NOTE — Progress Notes (Signed)
Summary: [facility]/ [facility])  Phone Note From Other Clinic   Summary of Call: Darl Pikes, from [employer] called in today because the pt will need the provider fax an order to repair the wheel chair (it should include diagnose code, and length of need which is life time). fax attention [phone]. If you have any further questions, please call back to Darl Pikes at her office number [phone] [facility] PA-c   Initial call taken by: Manon Hilding,  July 03, 2009 4:07 PM  Follow-up for Phone Call        forward to provider Follow-up by: Armenia Shannon,  July 03, 2009 4:36 PM  Additional Follow-up for Phone Call Additional follow up Details #1::        Have they faxed me something to fill out? What exactly do I need to do? Additional Follow-up by: Tereso Newcomer PA-C,  July 03, 2009 5:09 PM    Additional Follow-up for Phone Call Additional follow up Details #2::    Left message on answering machine for pt to call back.Marland KitchenMarland KitchenMarland KitchenArmenia Shannon  July 04, 2009 12:50 PM  SUSAN, CALLED BACK. THERE IS NOTHING THAT THEY NEED TO SEND, [name] JUST NEEDS TO WRITE THE ORDER AND ALSO SAY THAT IT IS A PERMANENT LIFE LONG CONDITION THAT HE HAS.Marland KitchenCala Bradford Tinnin  July 04, 2009 3:53 PM   Additional Follow-up for Phone Call Additional follow up Details #3:: Details for Additional Follow-up Action Taken: done  on your desk Additional Follow-up by: Brynda Rim,  July 05, 2009 2:15 PM     Impression & Recommendations:  Problem # 1:  QUADRIPLEGIA NOS (ICD-344.00) patient needs wheelchair fixed  Orders: Durable Medical Equipment (DME)  Complete Medication List: 1)  Therapeutic Multivitamin Tabs (Multiple vitamin) .Marland Kitchen.. 1 by mouth qday 2)  Baclofen 20 Mg Tabs (Baclofen) .Marland Kitchen.. 1 tab by 4 times daily 3)  Valium 10 Mg Tabs (Diazepam) .... One tab at bedtime 4)  Folic Acid 1 Mg Tabs (Folic acid) .Marland Kitchen.. 1 by mouth qday 5)  Asacol 400 Mg Tbec (Mesalamine) .... 2 tabs by mouth tid 6)  Natural Fiber  48.57 % Powd (Psyllium) .... Mix in water and drink one glass two times a day or as directed 7)  Tizanidine Hcl 4 Mg Tabs (Tizanidine hcl) .... Two tabs by by mouth qid

## 2010-05-15 NOTE — Letter (Signed)
Summary: PLAN OF CARE/10/27/09 TO 12/25/09  PLAN OF CARE/10/27/09 TO 12/25/09   Imported By: Silvio Pate Stan[facility]laws[facility]k 11/14/2009 08:16:44  _____________________________________________________________________  External Attachment:    Type:   Image     Comment:   External Document

## 2010-05-15 NOTE — Miscellaneous (Signed)
Summary: INITIAL SUMMARY  INITIAL SUMMARY   Imported By: Arta Bruce 05/30/2009 09:17:55  _____________________________________________________________________  External Attachment:    Type:   Image     Comment:   External Document

## 2010-05-15 NOTE — Letter (Signed)
Summary: PHYSICIANS ORDERS 03/15/09 TO 03/27/09  PHYSICIANS ORDERS 03/15/09 TO 03/27/09   Imported By: Silvio Pate Stanislawscyk 05/16/2009 12:42:59  _____________________________________________________________________  External Attachment:    Type:   Image     Comment:   External Document

## 2010-05-15 NOTE — Progress Notes (Signed)
Summary: Requesting for Dozepam Refills  Phone Note From Other Clinic   Summary of Call: Ms. Alan Benson from [facility] called today because the pt above needs more refills from "Dozepam" 10 mg.  The provider can fax the request to [phone].  If you need to contact the pharmacy directly, please call back at [phone]. Alben Spittle PA-c Initial call taken by: Manon Hilding,  October 19, 2009 2:27 PM  Follow-up for Phone Call        forward to provider Follow-up by: Armenia Shannon,  October 20, 2009 4:30 PM  Additional Follow-up for Phone Call Additional follow up Details #1::        Did few days ago.  In basket to fax. Additional Follow-up by: Tereso Newcomer PA-C,  October 20, 2009 4:59 PM    Additional Follow-up for Phone Call Additional follow up Details #2::    MargARET @ [facility] CONFIRMED THEY HAVE RECEIVED VALIUM Follow-up by: Gaylyn Cheers RN,  October 24, 2009 10:10 AM

## 2010-05-15 NOTE — Letter (Signed)
Summary: [facility]//FAXED  [facility]//FAXED   Imported By: Arta Bruce 10/02/2009 09:45:34  _____________________________________________________________________  External Attachment:    Type:   Image     Comment:   External Document

## 2010-05-15 NOTE — Medication Information (Signed)
Summary: NON-COVERED MEDICATION//FOLIC ACID  NON-COVERED MEDICATION//FOLIC ACID   Imported By: Arta Bruce 12/01/2009 10:34:42  _____________________________________________________________________  External Attachment:    Type:   Image     Comment:   External Document

## 2010-05-15 NOTE — Assessment & Plan Note (Signed)
Summary: FU VISIT / FOOT INJURE//GK   Vital Signs:  Patient profile:   35 year old male Weight:      88 pounds Temp:     94.5 degrees F oral Pulse rate:   76 / minute Pulse rhythm:   regular Resp:     18 per minute BP sitting:   124 / 78  (left arm) Cuff size:   regular  Vitals Entered By: Armenia Shannon (October 18, 2009 11:28 AM) CC: f/u Is Patient Diabetic? No Pain Assessment Patient in pain? no       Does patient need assistance? Functional Status Self care Ambulation Normal   Primary Care Provider:  Tereso Newcomer PA-C  CC:  f/u.  History of Present Illness: Here for f/u on foot.  Got his wheelchair fixed.  No more pain.  No fever.  Feels like his foot looks much better.  No other complaints today.  Current Medications (verified): 1)  Therapeutic Multivitamin   Tabs (Multiple Vitamin) .Marland Kitchen.. 1 By Mouth Qday 2)  Baclofen 20 Mg  Tabs (Baclofen) .Marland Kitchen.. 1 Tab By 4 Times Daily 3)  Valium 10 Mg Tabs (Diazepam) .... One Tab At Bedtime 4)  Folic Acid 1 Mg  Tabs (Folic Acid) .Marland Kitchen.. 1 By Mouth Qday 5)  Asacol 400 Mg  Tbec (Mesalamine) .... 2 Tabs By Mouth Tid 6)  Natural Fiber 48.57 % Powd (Psyllium) .... Mix in Water and Drink One Glass Two Times A Day or As Directed 7)  Tizanidine Hcl 4 Mg Tabs (Tizanidine Hcl) .... Two Tabs By By Mouth Qid 8)  Bactroban 2 % Oint (Mupirocin) .... Apply To Area Two Times A Day For 7 Days 9)  Ibuprofen 200 Mg Tabs (Ibuprofen) .... Take 3 To 4 Tabs By Mouth Every 8 Hours With Food  As Needed For Pain  Allergies (verified): 1)  ! Penicillin V Potassium (Penicillin V Potassium)  Physical Exam  General:  alert.   Head:  normocephalic and atraumatic.   Msk:  spasticity of all extremities limits exam he had to be kept strapped in his wheelchair for the exam Skin:  left foot: area over medial foot at great toe is healed with some residual erythema/scar tissue at medial great toe, healing ulcer noted with scab tissue in center, no surrounding erythema  and no drainage  Psych:  normally interactive.     Impression & Recommendations:  Problem # 1:  ABRASION, FOOT (ICD-917.0) Assessment Improved continue current measures f/u if worsens  Complete Medication List: 1)  Therapeutic Multivitamin Tabs (Multiple vitamin) .Marland Kitchen.. 1 by mouth qday 2)  Baclofen 20 Mg Tabs (Baclofen) .Marland Kitchen.. 1 tab by 4 times daily 3)  Valium 10 Mg Tabs (Diazepam) .... One tab at bedtime 4)  Folic Acid 1 Mg Tabs (Folic acid) .Marland Kitchen.. 1 by mouth qday 5)  Asacol 400 Mg Tbec (Mesalamine) .... 2 tabs by mouth tid 6)  Natural Fiber 48.57 % Powd (Psyllium) .... Mix in water and drink one glass two times a day or as directed 7)  Tizanidine Hcl 4 Mg Tabs (Tizanidine hcl) .... Two tabs by by mouth qid 8)  Bactroban 2 % Oint (Mupirocin) .... Apply to area two times a day for 7 days 9)  Ibuprofen 200 Mg Tabs (Ibuprofen) .... Take 3 to 4 tabs by mouth every 8 hours with food  as needed for pain  Patient Instructions: 1)  Continue to watch foot and notify me if not continuing to heal or you develop fevers  or drainage from your foot.

## 2010-05-15 NOTE — Miscellaneous (Signed)
Summary: LONG TERM CARE REPORT//MAILED  LONG TERM CARE REPORT//MAILED   Imported By: Arta Bruce 04/27/2009 10:32:35  _____________________________________________________________________  External Attachment:    Type:   Image     Comment:   External Document

## 2010-05-15 NOTE — Medication Information (Signed)
Summary: REQUEST FOR EYE DROPS  REQUEST FOR EYE DROPS   Imported By: Arta Bruce 02/19/2010 10:59:20  _____________________________________________________________________  External Attachment:    Type:   Image     Comment:   External Document

## 2010-05-15 NOTE — Letter (Signed)
Summary: PLAN OF CARE//12/31/08/ TO 02/28/09  PLAN OF CARE//12/31/08/ TO 02/28/09   Imported By: Silvio Pate Stanislawscyk 06/05/2009 09:01:50  _____________________________________________________________________  External Attachment:    Type:   Image     Comment:   External Document

## 2010-05-15 NOTE — Letter (Signed)
Summary: [facility]//FAXED  [facility]//FAXED   Imported By: Arta Bruce 10/03/2009 10:49:11  _____________________________________________________________________  External Attachment:    Type:   Image     Comment:   External Document

## 2010-05-15 NOTE — Letter (Signed)
Summary: [name]  [name]   Imported By: Arta Bruce 10/19/2009 15:02:04  _____________________________________________________________________  External Attachment:    Type:   Image     Comment:   External Document

## 2010-05-15 NOTE — Progress Notes (Signed)
Summary: wheelchair  Phone Note Call from Patient Call back at Home Phone [phone] Call back at [phone]   Summary of Call: SUSAN  FROM [facility] CALLED IN BECAUSE THE PT WANTS TO SHARE WITH THE PROVIDER THE PHONE NUMBER OF THE WHEEL CHAIR COMPANY WHICH IS  [phone] AND IN CASE THE PROVIDER NEEDS TO SPEAK FROM SOMEONE FROM THAT COMPANY HE CAN  ASK FOR  MARK BUCLEY. Lacey Dotson PA-C Initial call taken by: Manon Hilding,  September 26, 2009 9:10 AM  Follow-up for Phone Call        forward to provider Follow-up by: Armenia Shannon,  September 26, 2009 9:55 AM  Additional Follow-up for Phone Call Additional follow up Details #1::        Left message with his wheelchair vendor. Have not heard anything back. Tereso Newcomer PA-C  September 28, 2009 5:07 PM  Spoke to [place] via email.  His vendor is coming out to fix his chair. Tereso Newcomer PA-C  October 02, 2009 12:59 PM

## 2010-05-15 NOTE — Letter (Signed)
Summary: PHYSICANS ORDERS/12/14/09 TO 01/12/10//MAILED  PHYSICANS ORDERS/12/14/09 TO 01/12/10//MAILED   Imported By: Arta Bruce 12/25/2009 11:02:39  _____________________________________________________________________  External Attachment:    Type:   Image     Comment:   External Document

## 2010-05-15 NOTE — Progress Notes (Signed)
Summary: PHARMACY FORM NEEDS TO BE FILLED OUT  Phone Note Call from Patient Call back at Home Phone [phone]   Caller: Alan Benson Summary of Call: Alan Benson DROPPED OFF A URGENT PAPER FROM HIS PHARMACY THAT HIS FOLIC ACID IS NOT COVERED. AND THE FORM NEEDS TO BE FILLED OUT. Initial call taken by: Leodis Rains,  November 20, 2009 10:50 AM  Follow-up for Phone Call        forward to provider Follow-up by: Armenia Shannon,  November 20, 2009 4:05 PM  Additional Follow-up for Phone Call Additional follow up Details #1::        I looked at form and I am not sure what I need to do with it. Please find out from [facility]. I put form in your basket. Tereso Newcomer PA-C  November 22, 2009 6:01 PM     Additional Follow-up for Phone Call Additional follow up Details #2::    called [facility] and spoke with pharmacy and they said that this form was for the facility but the insurance is not paying for med that you can find an alternative med that insurance will pay for Follow-up by: Armenia Shannon,  November 23, 2009 10:40 AM  Additional Follow-up for Phone Call Additional follow up Details #3:: Details for Additional Follow-up Action Taken: Changed Rx to OTC brand. Medicaid is not going to cover anything. This will be cheaper. Rx is in basket to fax. I am not filling anything out on form as I do not think this is for me. Tereso Newcomer PA-C  November 23, 2009 12:30 PM   faxed paper..... and script....Marland KitchenMarland KitchenArmenia Shannon  November 23, 2009 4:03 PM   called and was unable to reach any one... Armenia Shannon  November 23, 2009 2:44 PM   New/Updated Medications: FOLIC ACID 400 MCG TABS (FOLIC ACID) Take 2 by mouth once daily Prescriptions: FOLIC ACID 400 MCG TABS (FOLIC ACID) Take 2 by mouth once daily  #60 x 11   Entered and Authorized by:   Tereso Newcomer PA-C   Signed by:   Tereso Newcomer PA-C on 11/23/2009   Method used:   Printed then faxed to ...       [facility] [address] (mail-order)    [address] [place], [place]  [address]       Ph: [phone]       Fax: [phone]   RxID:   [id number]

## 2010-05-15 NOTE — Letter (Signed)
Summary: REFERRAL PHYSICAL THERAPY  REFERRAL PHYSICAL THERAPY   Imported By: Arta Bruce 06/27/2009 12:37:05  _____________________________________________________________________  External Attachment:    Type:   Image     Comment:   External Document

## 2010-05-15 NOTE — Progress Notes (Signed)
Summary: MAR DROPPED OFF FOR SIG.  Phone Note Other Incoming   Caller: [facility] Summary of Call: MARGARET HAD CARLS MAR DROPPED OFF TO BE SIGNED OFF ON Initial call taken by: Leodis Rains,  July 21, 2009 12:30 PM  Follow-up for Phone Call        forward to provider... spoke with kim and she will outguided on your bookcase Follow-up by: Armenia Shannon,  July 21, 2009 12:43 PM

## 2010-05-15 NOTE — Letter (Signed)
Summary: PHYSICIAN'S ORDER  PHYSICIAN'S ORDER   Imported By: Arta Bruce 07/28/2009 10:25:27  _____________________________________________________________________  External Attachment:    Type:   Image     Comment:   External Document

## 2010-05-15 NOTE — Progress Notes (Signed)
Summary: NEEDS VALIUM REFILLED  Phone Note Call from Patient Call back at Home Phone [phone]   Caller: Alan Benson Reason for Call: Refill Medication Summary of Call: Alan Benson PT. MARGARET CALLED AND SAYS THAT Alan Benson NEEDS A REFILL ON HIS VALIUM, AND SHE WANTS TO KNOW IF WE CAN CALL IT INTO [facility].  HE ONLY HAS 2 PILLS LEFT AND THEY WOULD LIKE TO HAVE IT DELIVERED TODAY TO BELL Benson. Initial call taken by: Leodis Rains,  Aug 24, 2009 9:54 AM  Follow-up for Phone Call        forward to provider Follow-up by: Armenia Shannon,  Aug 24, 2009 10:53 AM  Additional Follow-up for Phone Call Additional follow up Details #1::        Please call in for me.    Additional Follow-up for Phone Call Additional follow up Details #2::    called med in Follow-up by: Armenia Shannon,  Aug 24, 2009 3:08 PM  Prescriptions: VALIUM 10 MG TABS (DIAZEPAM) ONE TAB AT BEDTIME  #30 x 0   Entered and Authorized by:   Tereso Newcomer PA-C   Signed by:   Tereso Newcomer PA-C on 08/24/2009   Method used:   Telephoned to ...       [facility] [address] (mail-order)       [address] [place], [place]  [address]       Ph: [phone]       Fax: [phone]   RxID:   [id number]

## 2010-05-15 NOTE — Letter (Signed)
Summary: PLANE OF CARE///06/29/09 TO 08/27/09  PLANE OF CARE///06/29/09 TO 08/27/09   Imported By: Silvio Pate Stanislawscyk 07/13/2009 09:11:33  _____________________________________________________________________  External Attachment:    Type:   Image     Comment:   External Document

## 2010-05-15 NOTE — Miscellaneous (Signed)
Summary: PLAN OF CARE 12/26/09 TO 02/23/10  PLAN OF CARE 12/26/09 TO 02/23/10   Imported By: Silvio Pate Stanislawscyk 01/08/2010 11:37:01  _____________________________________________________________________  External Attachment:    Type:   Image     Comment:   External Document  Appended Document: Flu Vaccine    Clinical Lists Changes  Orders: Added new Service order of Flu Vaccine 44yrs + [phone]) - Signed Added new Service order of Admin 1st Vaccine (60454) - Signed Added new Service order of Admin 1st Vaccine [facility]) [phone]) - Signed Observations: Added new observation of FLU VAX#1VIS: 11/07/09 version given January 31, 2010. (01/31/2010 14:03) Added new observation of FLU VAXLOT: [id number] (01/31/2010 14:03) Added new observation of FLU VAX EXP: 10/13/2010 (01/31/2010 14:03) Added new observation of FLU VAXBY: Gaylyn Cheers RN (01/31/2010 14:03) Added new observation of FLU VAXRTE: IM (01/31/2010 14:03) Added new observation of FLU VAX DSE: 0.5 ml (01/31/2010 14:03) Added new observation of FLU VAXMFR: GlaxoSmithKline (01/31/2010 14:03) Added new observation of FLU VAX SITE: right deltoid (01/31/2010 14:03) Added new observation of FLU VAX: Fluvax 3+ (01/31/2010 14:03)       Influenza Vaccine    Vaccine Type: Fluvax 3+    Site: right deltoid    Mfr: GlaxoSmithKline    Dose: 0.5 ml    Route: IM    Given by: Gaylyn Cheers RN    Exp. Date: 10/13/2010    Lot #: [id number]    VIS given: 11/07/09 version given January 31, 2010.  Flu Vaccine Consent Questions    Do you have a history of severe allergic reactions to this vaccine? no    Any prior history of allergic reactions to egg and/or gelatin? no    Do you have a sensitivity to the preservative Thimersol? no    Do you have a past history of Guillan-Barre Syndrome? no    Do you currently have an acute febrile illness? no    Have you ever had a severe reaction to latex? no    Vaccine information given and  explained to patient? yes

## 2010-05-15 NOTE — Letter (Signed)
Summary: ANNUAL PERSONAL CARE AND PLAN REVIEW  ANNUAL PERSONAL CARE AND PLAN REVIEW   Imported By: Arta Bruce 08/28/2009 10:55:43  _____________________________________________________________________  External Attachment:    Type:   Image     Comment:   External Document

## 2010-05-15 NOTE — Miscellaneous (Signed)
Summary: INITIAL SUMMARY  INITIAL SUMMARY   Imported By: Arta Bruce 05/30/2009 09:16:17  _____________________________________________________________________  External Attachment:    Type:   Image     Comment:   External Document

## 2010-05-15 NOTE — Letter (Signed)
Summary: PLAN OF CARE 08/28/09 TO 10/26/09  PLAN OF CARE 08/28/09 TO 10/26/09   Imported By: Silvio Pate Stanislawscyk 09/05/2009 09:33:10  _____________________________________________________________________  External Attachment:    Type:   Image     Comment:   External Document

## 2010-05-15 NOTE — Assessment & Plan Note (Signed)
Summary: Nurse visit - possible infected great toe // tl  Nurse Visit   Vital Signs:  Patient profile:   35 year old male Temp:     97.3 degrees F  Vitals Entered By: Dutch Quint RN (September 25, 2009 3:19 PM)   Review of Systems Derm:  Complains of changes in color of skin, changes in nail beds, and lesion(s); left great toe is swollen and reddened, medial aspect of left foot is reddened; area is sheared, open to several layers, draining mostly serous fluid, blood-tinged.  c/o pain more with pressure, most pain over bony aspect from great toe to below malleolus, mascerated, skin is moist..   Primary Care Provider:  Tereso Newcomer PA-C  CC:  pain and drainage from left foot.  History of Present Illness: Acute visit. Patient's wheelchair padding for his foot wore down.  Now left foot being rubbed by metal hardware.  Has sore on medial left foot.  Reports of pustular drainage from toe.  Jayr Papaleo denies toe pain.  Has a lot of pain over area that is on lateral foot.  No fevers.   Physical Exam  General:  alert, well-developed, and well-nourished.   Head:  normocephalic and atraumatic.   Neurologic:  alert & oriented X3.   Skin:  left foot large area of desquamation with granular tissue revealed small amount of surrounding erythema no pustular drainage left toe appears WNL    Impression & Recommendations:  Problem # 1:  ABRASION, FOOT (ICD-917.0) asked Nickolaus Bordelon to get me his wheelchair vendors name and I can call to get wheelchair repaired bactroban oint two times a day for 7 days RN check at [facility] house 2-3 x per week ibuprofen as needed pain f/u with me in 7-10 days  Complete Medication List: 1)  Therapeutic Multivitamin Tabs (Multiple vitamin) .Marland Kitchen.. 1 by mouth qday 2)  Baclofen 20 Mg Tabs (Baclofen) .Marland Kitchen.. 1 tab by 4 times daily 3)  Valium 10 Mg Tabs (Diazepam) .... One tab at bedtime 4)  Folic Acid 1 Mg Tabs (Folic acid) .Marland Kitchen.. 1 by mouth qday 5)  Asacol 400 Mg Tbec  (Mesalamine) .... 2 tabs by mouth tid 6)  Natural Fiber 48.57 % Powd (Psyllium) .... Mix in water and drink one glass two times a day or as directed 7)  Tizanidine Hcl 4 Mg Tabs (Tizanidine hcl) .... Two tabs by by mouth qid 8)  Bactroban 2 % Oint (Mupirocin) .... Apply to area two times a day for 7 days 9)  Ibuprofen 200 Mg Tabs (Ibuprofen) .... Take 3 to 4 tabs by mouth every 8 hours with food  as needed for pain  Other Orders: Dressing 4x4 UP (U0454)   Patient Instructions: 1)  Apply ointment two times a day for 7 days. 2)  Take ibuprofen as needed for pain. 3)  Get me the number for your wheelchair vendor and I can call him to get your chair fixed. 4)  Remain out of chair as much as possible until fixed so your foot will heal. 5)  Call if worse. 6)  Follow up with Scott in 7-10 days to recheck.  CC: pain and drainage from left foot Pain Assessment Patient in pain? yes     Location: foot Intensity: 9 Type: sharp  Does patient need assistance? Ambulation Wheelchair Comments hurts all the time, more pain when he puts pressure on it.  States occurred last Thursday, has rubbed foot against broken area in support boot, causing linear shear.  After provider visit, dressed wound with Bacitracin ointment, nonadhesive Telfa and ABD pads, wrapped in rolled gauze for protection.     Allergies: 1)  ! Penicillin V Potassium (Penicillin V Potassium)  Orders Added: 1)  Est. Patient Level III [52841] 2)  Dressing 4x4 UP [A6402] Prescriptions: IBUPROFEN 200 MG TABS (IBUPROFEN) Take 3 to 4 tabs by mouth every 8 hours with food  as needed for pain  #30 x 1   Entered and Authorized by:   Tereso Newcomer PA-C   Signed by:   Tereso Newcomer PA-C on 09/25/2009   Method used:   Print then Give to Patient   RxID:   [id number] BACTROBAN 2 % OINT (MUPIROCIN) apply to area two times a day for 7 days  #30 grams x 1   Entered and Authorized by:   Tereso Newcomer PA-C   Signed by:   Tereso Newcomer PA-C  on 09/25/2009   Method used:   Print then Give to Patient   RxID:   [id number]

## 2010-05-15 NOTE — Progress Notes (Signed)
Summary: Optive eye gtts  Phone Note Other Incoming   [facility] Summary of Call: Request for Rx for Optive 5%-not in our med list, but is in one from [facility] for eye lubrication/dry eyes--will fill.  Please let [facility] know I refilled for 1 year from mail order pharmacy that is listed in his chart--if this is not correct--can call or fax to pharmacy of their choice Initial call taken by: Julieanne Manson MD,  February 17, 2010 5:09 PM  Follow-up for Phone Call        [facility] notified they have already received medication. Follow-up by: Gaylyn Cheers RN,  February 19, 2010 3:05 PM    New/Updated Medications: OPTIVE 0.5-0.9 % SOLN (CARBOXYMETHYLCELLUL-GLYCERIN) 1-2 drops both eyes as needed for dry or red eyes Prescriptions: OPTIVE 0.5-0.9 % SOLN (CARBOXYMETHYLCELLUL-GLYCERIN) 1-2 drops both eyes as needed for dry or red eyes  #1 month x 11   Entered and Authorized by:   Julieanne Manson MD   Signed by:   Julieanne Manson MD on 02/17/2010   Method used:   Faxed to ...       [facility] [address] (mail-order)       [address] [place], [place]  [address]       Ph: [phone]       Fax: [phone]   RxID:   [id number]

## 2010-05-15 NOTE — Progress Notes (Signed)
  Phone Note Outgoing Call   Summary of Call: Please ask [facility] if his Metamucil can be changed.  Patient told me that it is very expensive for him.  [facility] and other pharmacies usually have a generic brand of metamucil that will likely be a lot cheaper.  Ask them how we go about getting this changed so he can get a generic and save money. Initial call taken by: Brynda Rim,  May 10, 2009 1:49 PM  Follow-up for Phone Call        Mrs.  [facility]ita [facility] from [facility] called and said pt is no longer taking the tizanidine hcl off the med list and they will take med list to pharmacy and get the prescriptions mr. Macio needs..... Bonita Quin said they use the med list that we give them to give to pharmacy.... when the med list is corrected i will fax to [phone] Follow-up by: Armenia Shannon,  May 10, 2009 2:58 PM    New/Updated Medications: VALIUM 10 MG TABS (DIAZEPAM) ONE TAB AT BEDTIME NATURAL FIBER 48.57 % POWD (PSYLLIUM) Mix in water and drink one glass two times a day or as directed          Allergies: 1)  ! Penicillin V Potassium (Penicillin V Potassium)

## 2010-05-17 NOTE — Letter (Signed)
Summary: LONG TERM CARE/MAILED SELF ADDRESSED ENV.  LONG TERM CARE/MAILED SELF ADDRESSED ENV.   Imported By: Arta Bruce 04/17/2010 10:09:38  _____________________________________________________________________  External Attachment:    Type:   Image     Comment:   External Document

## 2010-05-17 NOTE — Letter (Signed)
Summary: [facility] /CPOPY UNTIL ORGINAL CAN BE MAILED/FAXED  [facility] /CPOPY UNTIL ORGINAL CAN BE MAILED/FAXED   Imported By: Arta Bruce 04/10/2010 09:24:57  _____________________________________________________________________  External Attachment:    Type:   Image     Comment:   External Document

## 2010-05-17 NOTE — Letter (Signed)
Summary: [facility] PLAN OF CARE 04/25/10 TO 06/23/10 Lanney Gins  [facility] PLAN OF CARE 04/25/10 TO 06/23/10 //MAILED   Imported By: Silvio Pate Stanislawscyk 05/04/2010 12:06:14  _____________________________________________________________________  External Attachment:    Type:   Image     Comment:   External Document

## 2010-07-11 ENCOUNTER — Encounter (INDEPENDENT_AMBULATORY_CARE_PROVIDER_SITE_OTHER): Payer: Self-pay | Admitting: Nurse Practitioner

## 2010-07-17 NOTE — Letter (Signed)
Summary: PHYSICIANS ORDER//MAILED  PHYSICIANS ORDER//MAILED   Imported By: Arta Bruce 07/11/2010 15:17:49  _____________________________________________________________________  External Attachment:    Type:   Image     Comment:   External Document

## 2010-08-31 NOTE — Op Note (Signed)
Alan Benson, Alan Benson                ACCOUNT NO.:  [id number]   MEDICAL RECORD NO.:  [id number]          PATIENT TYPE:  AMB   LOCATION:  ENDO                         FACILITY:  [facility]   PHYSICIAN:  Graylin Shiver, M.D.   DATE OF BIRTH:  11/05/1975   DATE OF PROCEDURE:  12/21/2004  DATE OF DISCHARGE:                                 OPERATIVE REPORT   PROCEDURE:  Colonoscopy with biopsy.   INDICATIONS:  The patient is a 35 year old male with severe cerebral palsy  with chronic diarrhea. Stool studies were negative and no enteric pathogens  were found, no obvious abnormalities have been found to explain the  patient's diarrhea.   Informed consent was obtained after explanation of the risks of bleeding,  infection and perforation.   PREMEDICATION:  Fentanyl 100 mcg IV, Versed 10 mg IV.   DESCRIPTION OF PROCEDURE:  With the patient in the left lateral decubitus  position, a rectal exam was performed, no masses were felt. The Olympus  colonoscope was inserted into the rectum and advanced around the colon to  the cecum. Cecal landmarks were identified. The ileocecal valve was  intubated and the first few centimeters of terminal ileum were inspected and  looked normal. The cecum and ascending colon were normal, the transverse  colon normal, the descending colon, sigmoid and rectum were normal. Some  random biopsies were obtained from the right colon to look for any evidence  of collagenous colitis. He tolerated the procedure well without  complications.   IMPRESSION:  Normal colonoscopy to the cecum.           ______________________________  Graylin Shiver, M.D.     SFG/MEDQ  D:  12/21/2004  T:  12/21/2004  Job:  [id number]   cc:   Fleet Contras, M.D.  [address]  [place] [address]  Fax: [phone]  Email: [email or web address]

## 2010-10-02 ENCOUNTER — Ambulatory Visit: Payer: Medicaid Other | Attending: Family Medicine | Admitting: Physical Therapy

## 2010-10-02 DIAGNOSIS — G809 Cerebral palsy, unspecified: Secondary | ICD-10-CM | POA: Insufficient documentation

## 2010-10-02 DIAGNOSIS — M6281 Muscle weakness (generalized): Secondary | ICD-10-CM | POA: Insufficient documentation

## 2010-10-02 DIAGNOSIS — IMO0001 Reserved for inherently not codable concepts without codable children: Secondary | ICD-10-CM | POA: Insufficient documentation

## 2010-11-09 ENCOUNTER — Emergency Department (HOSPITAL_COMMUNITY)
Admission: EM | Admit: 2010-11-09 | Discharge: 2010-11-09 | Disposition: A | Discharge status: Home / Self Care | Payer: Medicaid Other | Attending: Emergency Medicine | Admitting: Emergency Medicine

## 2010-11-09 DIAGNOSIS — G809 Cerebral palsy, unspecified: Secondary | ICD-10-CM | POA: Insufficient documentation

## 2010-11-09 DIAGNOSIS — IMO0002 Reserved for concepts with insufficient information to code with codable children: Secondary | ICD-10-CM | POA: Insufficient documentation

## 2010-11-09 DIAGNOSIS — Z79899 Other long term (current) drug therapy: Secondary | ICD-10-CM | POA: Insufficient documentation

## 2010-11-12 ENCOUNTER — Inpatient Hospital Stay (INDEPENDENT_AMBULATORY_CARE_PROVIDER_SITE_OTHER)
Admission: RE | Admit: 2010-11-12 | Discharge: 2010-11-12 | Disposition: A | Discharge status: Home / Self Care | Payer: Medicaid Other | Source: Ambulatory Visit | Attending: Emergency Medicine | Admitting: Emergency Medicine

## 2010-11-12 DIAGNOSIS — IMO0002 Reserved for concepts with insufficient information to code with codable children: Secondary | ICD-10-CM

## 2010-11-12 LAB — WOUND CULTURE

## 2011-01-10 LAB — DIFFERENTIAL
Eosinophils Relative: 3
Lymphocytes Relative: 26
Lymphs Abs: 2.7
Monocytes Absolute: 1
Neutro Abs: 6.3

## 2011-01-10 LAB — CBC
HCT: 43.7
Hemoglobin: 14.8
WBC: 10.3

## 2011-01-10 LAB — POCT I-STAT, CHEM 8
BUN: 12
Calcium, Ion: 1.19
Chloride: 102
Creatinine, Ser: 1
Sodium: 140
TCO2: 30

## 2011-07-29 ENCOUNTER — Encounter: Payer: Self-pay | Admitting: Internal Medicine

## 2011-07-31 ENCOUNTER — Encounter: Payer: Self-pay | Admitting: *Deleted

## 2011-08-05 ENCOUNTER — Ambulatory Visit (INDEPENDENT_AMBULATORY_CARE_PROVIDER_SITE_OTHER): Payer: Medicaid Other | Admitting: Internal Medicine

## 2011-08-05 ENCOUNTER — Encounter: Payer: Self-pay | Admitting: Internal Medicine

## 2011-08-05 VITALS — BP 130/80 | HR 60

## 2011-08-05 DIAGNOSIS — K52832 Lymphocytic colitis: Secondary | ICD-10-CM

## 2011-08-05 DIAGNOSIS — K5289 Other specified noninfective gastroenteritis and colitis: Secondary | ICD-10-CM

## 2011-08-05 MED ORDER — PREDNISONE (PAK) 10 MG PO TABS: 30.0000 mg | ORAL_TABLET | Freq: Every day | ORAL | Status: AC

## 2011-08-05 NOTE — Progress Notes (Signed)
Subjective:    Patient ID: Alan Benson, male    DOB: 1976-01-12, 36 y.o.   MRN: [id number]  Referred by: Florentina Jenny, MD   HPI This is a 36 year old white man with cerebral palsy here for evaluation of abdominal pain and diarrhea. He resides at [facility]. He has a history of chronic diarrhea. He tells me for many years she's had frequent postprandial stools and he cannot make it to the bathroom he wears a diaper. He is wheelchair bound and has severe cerebral palsy. In 2006 he underwent a colonoscopy by Dr. Evette Cristal and it looks normal but random biopsies showed lymphocytic colitis. As best I can tell he was started on Asacol at that time and he has been on 800 mg 3 times a day. Recently that has been changed to Delzicol. He says this did not make a difference. Review of visit notes indicates he had some type of a gastrointestinal illness in the winter. In the descending frequent stools since then. It looks like he was thought to have had a viral gastroenteritis. He was sent for further evaluation of these problems. He is unaware if he is having any bleeding. He had a normal hemoglobin in August of 2012. He indicates he has postprandial cramps and defecation on a regular basis about 4 times a day. He does not have nocturnal problems. There is no nausea and vomiting. He says stressful situations may precipitate cramps and loose stools as well. GI review of systems is otherwise negative as best I can tell. He has not had an infection requiring antibiotics her hospitalization recently.   Allergies  Allergen Reactions  . Penicillins     REACTION: Hives   Outpatient Prescriptions Prior to Visit  Medication Sig Dispense Refill  . baclofen (LIORESAL) 20 MG tablet Take 20 mg by mouth 4 (four) times daily.      . chlorhexidine (PERIDEX) 0.12 % solution Use as directed 15 mLs in the mouth or throat every morning.      . diazepam (VALIUM) 10 MG tablet Take 10 mg by mouth at bedtime.      . Multiple  Vitamins-Iron (MULTIVITAMINS WITH IRON) TABS Take 1 tablet by mouth daily.      Marland Kitchen omeprazole (PRILOSEC) 20 MG capsule Take 20 mg by mouth 2 (two) times daily.      . polyethylene glycol (MIRALAX / GLYCOLAX) packet Take 17 g by mouth daily as needed.      Marland Kitchen tiZANidine (ZANAFLEX) 4 MG capsule Take 8 mg by mouth 4 (four) times daily.      . mesalamine (ASACOL) 400 MG EC tablet Take 800 mg by mouth 3 (three) times daily.       Past Medical History  Diagnosis Date  . Lymphocytic colitis 12/21/2004  . Failure to thrive   . Varicose vein   . Cerebral palsy   . CNS disorder   . Severe flexion contractures of all joints   . Debility, unspecified    Past Surgical History  Procedure Date  . Colonoscopy 2006   History   Social History  . Marital Status: Single    Spouse Name: N/A    Number of Children: N/A  . Years of Education: N/A   Occupational History  . Disabled  cerebral palsy    Social History Main Topics  . Smoking status: Never Smoker   . Smokeless tobacco: Never Used  . Alcohol Use: No  . Drug Use: No      Review  of Systems The patient has cerebral palsy and is in a motorized wheelchair. He has spasticity issues. It is reported that she has remained underweight and may have lost weight though I do not have the ability to weigh the patient nor are there access to weights. All other view of systems negative or as mentioned in the history of present illness.    Objective:   Physical Exam General:  In wheelchair, contractures, spastic with cerebral palsy, thin Eyes:  anicteric. ENT:   Mouth and posterior pharynx free of lesions, poor dentition Neck:   supple w/o thyromegaly or mass.  Lungs: Clear to auscultation bilaterally anterior Heart:  S1S2, no rubs, murmurs, gallops. Abdomen:  soft, non-tender, no hepatosplenomegaly, hernia, or mass and BS+.  Lymph:  no cervical or supraclavicular adenopathy. Extremities:   no edema Skin   sunburn Neuro:  A&O x 3.  Psych:    appropriate mood and affect with CP   Data Reviewed:  NP visit notes, old colonoscopy and surgical pathology 2006  Hgb 14.7 in 11/2010 TSH normal 11/2010     Assessment & Plan:   1. Lymphocytic colitis    Presumably, this the cause of his diarrhea. Sometimes people with these findings on biopsies actually have IBS. He has not really responded to 2.4 g of mesalamine daily. I am going to put him on prednisone with a taper over the next month to see if that makes a difference. There is no effect, I would shift and approach towards anti-spasmodic medication. Would give some consideration towards using something like colestipol or cholestyramine as well. Would also likely perform additional lab work up when he returns in about 6-8 weeks (if not improved). If he is better at that time, I think we can try to discontinue the mesalamine, which can sometimes cause a paradoxical diarrhea. I elected to continue that for the time being. I want to make one change at a time.  CC: Marletta Lor, ANP (Dr. Florentina Jenny - [facility])

## 2011-08-05 NOTE — Patient Instructions (Signed)
Dr. Leone Payor wants you to try a treatment course of Prednisone 10mg  , written RX given to you today for this, take as directed.  Follow-up appointment in 6 weeks.

## 2011-11-05 NOTE — Progress Notes (Signed)
Patient ID: Alan Benson, male   DOB: 1976-01-23, 36 y.o.   MRN: [id number] Called pharmacy prior auth on Delzicol after not hearing from them after sending form in to [facility] at [phone]. Per Sherald Barge at this # states  requires [place] to check all prior authorizations online since they are paperless. I tried to get info on web site and  did not have any luck. So I called [facility] where patient lives. They state PCP changed pts. Rx. Their info. Is Marletta Lor, NP at [phone] ext. [phone].

## 2011-11-19 ENCOUNTER — Other Ambulatory Visit (INDEPENDENT_AMBULATORY_CARE_PROVIDER_SITE_OTHER): Payer: Medicaid Other

## 2011-11-19 ENCOUNTER — Ambulatory Visit (INDEPENDENT_AMBULATORY_CARE_PROVIDER_SITE_OTHER): Payer: Medicaid Other | Admitting: Internal Medicine

## 2011-11-19 ENCOUNTER — Encounter: Payer: Self-pay | Admitting: Internal Medicine

## 2011-11-19 VITALS — HR 80 | Ht 65.0 in | Wt 84.0 lb

## 2011-11-19 DIAGNOSIS — K52832 Lymphocytic colitis: Secondary | ICD-10-CM

## 2011-11-19 DIAGNOSIS — K5289 Other specified noninfective gastroenteritis and colitis: Secondary | ICD-10-CM

## 2011-11-19 DIAGNOSIS — R1010 Upper abdominal pain, unspecified: Secondary | ICD-10-CM

## 2011-11-19 DIAGNOSIS — R197 Diarrhea, unspecified: Secondary | ICD-10-CM

## 2011-11-19 DIAGNOSIS — R109 Unspecified abdominal pain: Secondary | ICD-10-CM

## 2011-11-19 LAB — IGA: IgA: 129 mg/dL (ref 68–378)

## 2011-11-19 MED ORDER — DICYCLOMINE HCL 20 MG PO TABS: 20.0000 mg | ORAL_TABLET | Freq: Three times a day (TID) | ORAL | Status: DC

## 2011-11-19 NOTE — Patient Instructions (Addendum)
Dicyclomine has been prescribed to help with abdominal pain and diarrhea. You are being tested for celiac disease and we will let you know those results in 1-2 weeks.   Your physician has requested that you go to the basement for the following lab work before leaving today: CBC, IGA, TTG  Thank you for choosing me and [facility].  Iva Boop, MD, Clementeen Graham

## 2011-11-19 NOTE — Progress Notes (Signed)
  Subjective:    Patient ID: Alan Benson, male    DOB: [birth year], 36 y.o.   MRN: [id number]  HPI The patient returns with a care assistant. He has a history of microscopic (lymphocytic) colitis and has been maintained on Asacol, now on delzicol. He was seen in April and I gave hime a course of prednisone. He still has post-prandial upper abdominal pain and diarrhea that is bothersome. We do not think he has bleeding. He believes his symptoms are similar to the problems he has had over the years.  Medications, allergies, past medical history, past surgical history, family history and social history are reviewed and updated in the EMR.    Review of Systems Remains in wheelchair with cerebral palsy problems    Objective:   Physical Exam Chronically ill Contractures Slow speech Abdomen is thin, soft and nontender       Assessment & Plan:   1. Diarrhea  Tissue transglutaminase, IgA, IgA, CBC with Differential  2. Upper abdominal pain  Tissue transglutaminase, IgA, IgA, CBC with Differential  3. Lymphocytic colitis  Tissue transglutaminase, IgA, IgA, CBC with Differential   1. Check for celiac disease 2. Dicyclomine 20 mg before meals 3. If celiac Ab + would probably do diet without EGd as he is higher risk to sedate  NW:GNFAO, Sherilyn Cooter, MD

## 2011-11-20 LAB — TISSUE TRANSGLUTAMINASE, IGA: Tissue Transglutaminase Ab, IgA: 2.2 U/mL (ref <20)

## 2011-11-21 NOTE — Progress Notes (Signed)
Quick Note:  Tests are negative for celiac disease Please call [facility] and let him know See if we can find out if dicyclomine is helping cramps and diarrhea  Probably just need to set up a follow-up for late sept ______

## 2011-11-22 ENCOUNTER — Other Ambulatory Visit (INDEPENDENT_AMBULATORY_CARE_PROVIDER_SITE_OTHER): Payer: Medicaid Other

## 2011-11-22 DIAGNOSIS — R109 Unspecified abdominal pain: Secondary | ICD-10-CM

## 2011-11-22 DIAGNOSIS — R1010 Upper abdominal pain, unspecified: Secondary | ICD-10-CM

## 2011-11-22 DIAGNOSIS — K52832 Lymphocytic colitis: Secondary | ICD-10-CM

## 2011-11-22 DIAGNOSIS — R197 Diarrhea, unspecified: Secondary | ICD-10-CM

## 2011-11-22 DIAGNOSIS — K5289 Other specified noninfective gastroenteritis and colitis: Secondary | ICD-10-CM

## 2011-11-22 LAB — CBC WITH DIFFERENTIAL/PLATELET
Basophils Absolute: 0 10*3/uL (ref 0.0–0.1)
Basophils Relative: 0.7 % (ref 0.0–3.0)
HCT: 42.2 % (ref 39.0–52.0)
Hemoglobin: 14.2 g/dL (ref 13.0–17.0)
Lymphocytes Relative: 27.3 % (ref 12.0–46.0)
Lymphs Abs: 1.6 10*3/uL (ref 0.7–4.0)
Monocytes Relative: 7.1 % (ref 3.0–12.0)
Neutro Abs: 3.6 10*3/uL (ref 1.4–7.7)
RBC: 4.69 Mil/uL (ref 4.22–5.81)
RDW: 12.9 % (ref 11.5–14.6)

## 2011-11-27 NOTE — Progress Notes (Signed)
Quick Note:  OK - if he is doing really well he does not need to follow up except prn - I was not certain how well phone communication would work but sounds like it did ______

## 2012-01-23 ENCOUNTER — Encounter: Payer: Self-pay | Admitting: Internal Medicine

## 2012-01-23 ENCOUNTER — Ambulatory Visit (INDEPENDENT_AMBULATORY_CARE_PROVIDER_SITE_OTHER): Payer: Medicaid Other | Admitting: Internal Medicine

## 2012-01-23 VITALS — BP 100/60 | HR 64

## 2012-01-23 DIAGNOSIS — K589 Irritable bowel syndrome without diarrhea: Secondary | ICD-10-CM

## 2012-01-23 MED ORDER — GLYCOPYRROLATE 2 MG PO TABS: 2.0000 mg | ORAL_TABLET | Freq: Two times a day (BID) | ORAL | Status: DC

## 2012-01-23 NOTE — Progress Notes (Signed)
  Subjective:    Patient ID: Alan Benson, male    DOB: May 23, 1975, 36 y.o.   MRN: [id number]  HPI Alan Benson is a very pleasant 36 year old man with cerebral palsy. In 2006 he had a colonoscopy because of diarrhea and was diagnosed with lymphocytic colitis. He was started on mesalamine at that time. I have seen him twice this year. His complaints are urgent defecation that is uncontrollable. Over the last 2 and now this visit I get a better picture for what is going on and it sounds like even with starting mesalamine years ago he has had chronic problems with unpredictable defecation that is loose. He works at a computer work shop, and will have an expected defecation at that time. He does wear a diaper. 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 He describes abdominal cramps that last for several hours and then at some point he will have a bowel movement. The stools are soft and there are not reported to be bloody or very loose or watery. This really never got better with mesalamine he tells me. He has a care giver with him who helps with some translation or interpreting given his altered speech with his cerebral palsy.  He specifically describes that when he goes to a sitting position and is upright from being on his belly, he will often have this problem with defecation though this occurs about twice a day. His other medications include dicyclomine it does not sound like that has helped. A trial of prednisone was given because of his history of lymphocytic colitis as well. Over time it seems like everything has been about the same. Medications, allergies, past medical history, past surgical history, family history and social history are reviewed and updated in the EMR.  Review of Systems As above.    Objective:   Physical Exam middle-aged white man with cerebral palsy with significant muscle spasticity Speech is understandable most of the time though I need some help from his caregiver to  interpret Abdomen is thin Rectal exam with the patient on his abdomen, prone reveals no impaction and soft tan stool. Sphincter tone appears normal.        Assessment & Plan:   1. IBS (irritable bowel syndrome)     1. I think his situation is more consistent with irritable bowel syndrome. Perhaps related to his spasticity. He says he has been told that. 2. I'm going to discontinue dicyclomine, mesalamine and methylcellulose fiber supplement. 3. Start glycopyrrolate 2 mg twice a day. This may have an added managing helping with his hypersalivation. 4. Return in 6 weeks, instructions given to call back sooner if diarrhea and or significant constipation develops. 5. This is clearly a difficult problem given history will palsy and spasticity I will try to help him with his quality of life. Next that could be something like Imodium though I fear the potential development of significant constipation.  CC: Marletta Lor, NP

## 2012-01-23 NOTE — Progress Notes (Signed)
Patient ID: Alan Benson, male   DOB: Aug 31, 1975, 36 y.o.   MRN: [id number] Faxed order to [facility] to D/C soluble fiber.

## 2012-01-23 NOTE — Patient Instructions (Addendum)
We have stopped your Delzicol, Miralax, and Dicyclomine.  We have given you an Rx for generic Robinul.  If your diarrhea or constipation returns call back for adjustments.  Thank you for choosing me and [facility].  Iva Boop, M.D., [facility]

## 2012-02-28 ENCOUNTER — Telehealth: Payer: Self-pay | Admitting: Internal Medicine

## 2012-02-28 ENCOUNTER — Ambulatory Visit (INDEPENDENT_AMBULATORY_CARE_PROVIDER_SITE_OTHER): Payer: Medicaid Other | Admitting: Internal Medicine

## 2012-02-28 ENCOUNTER — Encounter: Payer: Self-pay | Admitting: Internal Medicine

## 2012-02-28 VITALS — BP 110/80 | HR 79

## 2012-02-28 DIAGNOSIS — K5289 Other specified noninfective gastroenteritis and colitis: Secondary | ICD-10-CM

## 2012-02-28 DIAGNOSIS — R197 Diarrhea, unspecified: Secondary | ICD-10-CM

## 2012-02-28 DIAGNOSIS — R1033 Periumbilical pain: Secondary | ICD-10-CM

## 2012-02-28 DIAGNOSIS — K52832 Lymphocytic colitis: Secondary | ICD-10-CM

## 2012-02-28 DIAGNOSIS — R52 Pain, unspecified: Secondary | ICD-10-CM

## 2012-02-28 DIAGNOSIS — K529 Noninfective gastroenteritis and colitis, unspecified: Secondary | ICD-10-CM

## 2012-02-28 MED ORDER — DIPHENOXYLATE-ATROPINE 2.5-0.025 MG PO TABS: 1.0000 | ORAL_TABLET | Freq: Three times a day (TID) | ORAL | Status: DC

## 2012-02-28 NOTE — Telephone Encounter (Signed)
I have called and left a message for the supervisor on duty of [facility] where he resides.

## 2012-02-28 NOTE — Patient Instructions (Addendum)
Stop loperamide (Imodium). Start Lomotil (diphenoxylate and atropine) 1 before meals and bedtime - stop/hold if constipated (no bowel movemnt in 2 days) Stool tests for C diff and white blood cells will need to be collected - please do on a weekday and return to the [place] lab. Once those results are in we will call and ask how things are going also. Will want to know if the Lomotil is working. You may need a repeat colonoscopy in the future. I may also use a medication that bulks up the stools more.  Iva Boop, MD, [facility]

## 2012-02-28 NOTE — Telephone Encounter (Signed)
I spoke with Misty Stanley from [facility].  4-5 day of worsening diarrhea despite glycopyrrolate BID.  He reports that he is not feeling well and she is concerned about dehydration. They will bring him for an appt today at 3:45

## 2012-02-28 NOTE — Progress Notes (Signed)
  Subjective:    Patient ID: Alan Benson, male    DOB: 1975-10-01, 36 y.o.   MRN: [id number]  HPI Dyan returns with staff from [facility]. He did well for 2 weeks on glycopyrrolate alone. However he then started to have loose stools that became progressivlely looser. In the past week several loose stools a day, especially when sitting up. Some periumbilical abdominal pain. He feels fatigued. No fever. Loperamide was used with slight help, taking after stools. No sick contacts. Stools not usually nocturnal.  Medications, allergies, past medical history, past surgical history, family history and social history are reviewed and updated in the EMR.  Review of Systems As above    Objective:   Physical Exam Spastic, in w/c CP affect Abdomen mildly tender in midline, periumbilical       Assessment & Plan:   1. Chronic diarrhea  Clostridium Difficile by PCR, Fecal lactoferrin  2. Abdominal pain, acute, periumbilical    3. Lymphocytic colitis      1. Still hard to tell what his problem is. Celiac testing has been negative. Not sure he has persistent lymphocytic colitis as he did not respond to prednisone. ? Impaction but no constipation at all reported. Had normal formed stool in first 1-2 weeks after stopping dicyclomine and metamucil last visit. 2. Ad lomotil before meals and at bedtime. He said he had been on in past. 3. Check for C diff and lactoferrin 4. Consider adding bile acid binder, empiric antibiotics 5. ? Needs a repeat colonoscopy and biopsies - if above fail to reveal a cause or yield a response   GY:IRSW, Raynelle Fanning, NP

## 2012-03-04 ENCOUNTER — Encounter (HOSPITAL_COMMUNITY): Payer: Self-pay | Admitting: *Deleted

## 2012-03-04 ENCOUNTER — Emergency Department (HOSPITAL_COMMUNITY): Payer: Medicaid Other

## 2012-03-04 ENCOUNTER — Emergency Department (HOSPITAL_COMMUNITY)
Admission: EM | Admit: 2012-03-04 | Discharge: 2012-03-04 | Disposition: A | Discharge status: Skilled Nursing Facility | Payer: Medicaid Other | Attending: Emergency Medicine | Admitting: Emergency Medicine

## 2012-03-04 DIAGNOSIS — R5381 Other malaise: Secondary | ICD-10-CM | POA: Insufficient documentation

## 2012-03-04 DIAGNOSIS — Z79899 Other long term (current) drug therapy: Secondary | ICD-10-CM | POA: Insufficient documentation

## 2012-03-04 DIAGNOSIS — R197 Diarrhea, unspecified: Secondary | ICD-10-CM | POA: Insufficient documentation

## 2012-03-04 DIAGNOSIS — M2450 Contracture, unspecified joint: Secondary | ICD-10-CM | POA: Insufficient documentation

## 2012-03-04 DIAGNOSIS — R109 Unspecified abdominal pain: Secondary | ICD-10-CM | POA: Insufficient documentation

## 2012-03-04 DIAGNOSIS — I83893 Varicose veins of bilateral lower extremities with other complications: Secondary | ICD-10-CM | POA: Insufficient documentation

## 2012-03-04 DIAGNOSIS — G809 Cerebral palsy, unspecified: Secondary | ICD-10-CM | POA: Insufficient documentation

## 2012-03-04 DIAGNOSIS — K529 Noninfective gastroenteritis and colitis, unspecified: Secondary | ICD-10-CM

## 2012-03-04 DIAGNOSIS — Z8719 Personal history of other diseases of the digestive system: Secondary | ICD-10-CM | POA: Insufficient documentation

## 2012-03-04 LAB — CBC WITH DIFFERENTIAL/PLATELET
Eosinophils Absolute: 0 10*3/uL (ref 0.0–0.7)
Eosinophils Relative: 0 % (ref 0–5)
HCT: 42 % (ref 39.0–52.0)
Hemoglobin: 14.9 g/dL (ref 13.0–17.0)
Lymphocytes Relative: 4 % — ABNORMAL LOW (ref 12–46)
Lymphs Abs: 0.7 10*3/uL (ref 0.7–4.0)
MCH: 30.1 pg (ref 26.0–34.0)
MCV: 84.8 fL (ref 78.0–100.0)
Monocytes Absolute: 0.5 10*3/uL (ref 0.1–1.0)
Monocytes Relative: 3 % (ref 3–12)
Platelets: 254 10*3/uL (ref 150–400)
RBC: 4.95 MIL/uL (ref 4.22–5.81)
WBC: 17.6 10*3/uL — ABNORMAL HIGH (ref 4.0–10.5)

## 2012-03-04 LAB — COMPREHENSIVE METABOLIC PANEL
ALT: 15 U/L (ref 0–53)
BUN: 19 mg/dL (ref 6–23)
CO2: 19 mEq/L (ref 19–32)
Calcium: 9.4 mg/dL (ref 8.4–10.5)
GFR calc Af Amer: >90 mL/min (ref >90)
GFR calc non Af Amer: >90 mL/min (ref >90)
Glucose, Bld: 120 mg/dL — ABNORMAL HIGH (ref 70–99)
Sodium: 141 mEq/L (ref 135–145)
Total Protein: 8.1 g/dL (ref 6.0–8.3)

## 2012-03-04 MED ORDER — DIAZEPAM 5 MG/ML IJ SOLN
5.0000 mg | Freq: Once | INTRAMUSCULAR | Status: AC
  Administered 2012-03-04: 5 mg via INTRAVENOUS
  Filled 2012-03-04: qty 2

## 2012-03-04 MED ORDER — SODIUM CHLORIDE 0.9 % IV SOLN
Freq: Once | INTRAVENOUS | Status: AC
  Administered 2012-03-04: 15:00:00 via INTRAVENOUS

## 2012-03-04 MED ORDER — ONDANSETRON HCL 4 MG/2ML IJ SOLN
4.0000 mg | Freq: Once | INTRAMUSCULAR | Status: AC
  Administered 2012-03-04: 4 mg via INTRAVENOUS
  Filled 2012-03-04: qty 2

## 2012-03-04 MED ORDER — IOHEXOL 300 MG/ML  SOLN
80.0000 mL | Freq: Once | INTRAMUSCULAR | Status: AC | PRN
  Administered 2012-03-04: 80 mL via INTRAVENOUS

## 2012-03-04 MED ORDER — SODIUM CHLORIDE 0.9 % IV BOLUS (SEPSIS)
500.0000 mL | Freq: Once | INTRAVENOUS | Status: AC
  Administered 2012-03-04: 500 mL via INTRAVENOUS

## 2012-03-04 MED ORDER — ONDANSETRON 8 MG PO TBDP: 8.0000 mg | ORAL_TABLET | Freq: Three times a day (TID) | ORAL | Status: DC | PRN

## 2012-03-04 NOTE — ED Notes (Signed)
[facility] called for transport.  

## 2012-03-04 NOTE — ED Notes (Signed)
Pt presents to ed with c/o nausea vomiting and diarrhea for 3-4 weeks now. Pt is c/o adominal pain and constant nausea. Pt has cerebral palsy and is lying flat on his abdomen stating that is only comfortable position for him. Pt had 2 episodes of emesis in department. PIV inserted.

## 2012-03-04 NOTE — ED Provider Notes (Signed)
Medical screening examination/treatment/procedure(s) were performed by non-physician practitioner and as supervising physician I was immediately available for consultation/collaboration.  Yobany Vroom L Catherene Kaleta, MD 03/04/12 2015 

## 2012-03-04 NOTE — ED Notes (Signed)
Per EMS: pt is coming from [facility] with c/o N/V/D that has been going on for last 2-3 weeks. Pt has hx of cerebral palsy.

## 2012-03-04 NOTE — ED Notes (Signed)
FAO:[id number]<MV> Expected date:<BR> Expected time:<BR> Means of arrival:<BR> Comments:<BR> 35yo-nsg facility- vomiting

## 2012-03-04 NOTE — ED Provider Notes (Signed)
History     CSN: [id number]  Arrival date & time 03/04/12  1127   First MD Initiated Contact with Patient 03/04/12 1305      Chief Complaint  Patient presents with  . Nausea  . Emesis    (Consider location/radiation/quality/duration/timing/severity/associated sxs/prior treatment) Patient is a 36 y.o. male presenting with vomiting. The history is provided by the patient.  Emesis  This is a new problem. The current episode started 6 to 12 hours ago. There has been no fever. Associated symptoms include abdominal pain. Pertinent negatives include no chills and no fever. Associated symptoms comments: The patient is a resident of [facility] and presents, per staff, for nausea and vomiting that started earlier today. He has a history of chronic diarrhea without vomiting. He has also had abdominal pain today, which is not per usual symptoms.  No reported fever. .    Past Medical History  Diagnosis Date  . Lymphocytic colitis 12/21/2004  . Failure to thrive in childhood   . Varicose vein   . Cerebral palsy   . Severe flexion contractures of all joints     Past Surgical History  Procedure Date  . Colonoscopy 2006    History reviewed. No pertinent family history.  History  Substance Use Topics  . Smoking status: Never Smoker   . Smokeless tobacco: Never Used  . Alcohol Use: No      Review of Systems  Constitutional: Negative for fever and chills.  HENT: Negative.   Respiratory: Negative.  Negative for shortness of breath.   Cardiovascular: Negative.  Negative for chest pain.  Gastrointestinal: Positive for nausea, vomiting and abdominal pain.  Genitourinary: Negative for dysuria.  Musculoskeletal: Negative.   Skin: Negative.   Neurological: Positive for weakness.    Allergies  Penicillins  Home Medications   Current Outpatient Rx  Name  Route  Sig  Dispense  Refill  . ACETAMINOPHEN 325 MG PO TABS   Oral   Take 325 mg by mouth. Take 2 tabs by mouth 4 times a day  as needed         . BACLOFEN 20 MG PO TABS   Oral   Take 20 mg by mouth 4 (four) times daily.         . CHLORHEXIDINE GLUCONATE 0.12 % MT SOLN   Mouth/Throat   Use as directed 15 mLs in the mouth or throat every morning.         Marland Kitchen DIAZEPAM 10 MG PO TABS   Oral   Take 10 mg by mouth at bedtime.         Marland Kitchen DIPHENOXYLATE-ATROPINE 2.5-0.025 MG PO TABS   Oral   Take 1 tablet by mouth 4 (four) times daily -  before meals and at bedtime. Hold for constipation   120 tablet   5   . FOLIC ACID 0.8 MG PO CAPS   Oral   Take 0.8 mg by mouth daily.         Marland Kitchen GLYCOPYRROLATE 2 MG PO TABS   Oral   Take 1 tablet (2 mg total) by mouth 2 (two) times daily.   60 tablet   11   . TAB-A-VITE/IRON PO TABS   Oral   Take 1 tablet by mouth daily.         Marland Kitchen PROMETHAZINE HCL 12.5 MG PO TABS   Oral   Take 12.5 mg by mouth every 6 (six) hours as needed. NAUSEA         .  TIZANIDINE HCL 4 MG PO CAPS   Oral   Take 8 mg by mouth 4 (four) times daily.           BP 139/101  Pulse 104  Temp 98.3 F (36.8 C) (Oral)  Resp 20  SpO2 95%  Physical Exam  Constitutional: He appears well-developed and well-nourished.  HENT:  Head: Normocephalic.  Mouth/Throat: Mucous membranes are dry.  Neck: Normal range of motion. Neck supple.  Cardiovascular: Normal rate and regular rhythm.   Pulmonary/Chest: Effort normal and breath sounds normal.  Abdominal: Soft. Bowel sounds are normal. There is tenderness. There is no rebound and no guarding.       Diffusely tender abdomen.   Musculoskeletal: Normal range of motion.  Neurological: He is alert.       Severe contractures all extremities with moderate atrophy.  Skin: Skin is warm and dry. No rash noted.  Psychiatric: He has a normal mood and affect.    ED Course  Procedures (including critical care time)  Labs Reviewed  GLUCOSE, CAPILLARY - Abnormal; Notable for the following:    Glucose-Capillary 120 (*)     All other components within  normal limits  CBC WITH DIFFERENTIAL - Abnormal; Notable for the following:    WBC 17.6 (*)     Neutrophils Relative 93 (*)     Neutro Abs 16.3 (*)     Lymphocytes Relative 4 (*)     All other components within normal limits  COMPREHENSIVE METABOLIC PANEL - Abnormal; Notable for the following:    Glucose, Bld 120 (*)     Total Bilirubin 1.7 (*)     All other components within normal limits  CLOSTRIDIUM DIFFICILE BY PCR   Results for orders placed during the hospital encounter of 03/04/12  GLUCOSE, CAPILLARY      Component Value Range   Glucose-Capillary 120 (*) 70 - 99 mg/dL   Comment 1 Notify RN    CBC WITH DIFFERENTIAL      Component Value Range   WBC 17.6 (*) 4.0 - 10.5 K/uL   RBC 4.95  4.22 - 5.81 MIL/uL   Hemoglobin 14.9  13.0 - 17.0 g/dL   HCT 47.8  29.5 - 62.1 %   MCV 84.8  78.0 - 100.0 fL   MCH 30.1  26.0 - 34.0 pg   MCHC 35.5  30.0 - 36.0 g/dL   RDW 30.8  65.7 - 84.6 %   Platelets 254  150 - 400 K/uL   Neutrophils Relative 93 (*) 43 - 77 %   Neutro Abs 16.3 (*) 1.7 - 7.7 K/uL   Lymphocytes Relative 4 (*) 12 - 46 %   Lymphs Abs 0.7  0.7 - 4.0 K/uL   Monocytes Relative 3  3 - 12 %   Monocytes Absolute 0.5  0.1 - 1.0 K/uL   Eosinophils Relative 0  0 - 5 %   Eosinophils Absolute 0.0  0.0 - 0.7 K/uL   Basophils Relative 0  0 - 1 %   Basophils Absolute 0.0  0.0 - 0.1 K/uL  COMPREHENSIVE METABOLIC PANEL      Component Value Range   Sodium 141  135 - 145 mEq/L   Potassium 4.1  3.5 - 5.1 mEq/L   Chloride 99  96 - 112 mEq/L   CO2 19  19 - 32 mEq/L   Glucose, Bld 120 (*) 70 - 99 mg/dL   BUN 19  6 - 23 mg/dL   Creatinine, Ser 9.62  0.50 -  1.35 mg/dL   Calcium 9.4  8.4 - 56.2 mg/dL   Total Protein 8.1  6.0 - 8.3 g/dL   Albumin 4.2  3.5 - 5.2 g/dL   AST 24  0 - 37 U/L   ALT 15  0 - 53 U/L   Alkaline Phosphatase 86  39 - 117 U/L   Total Bilirubin 1.7 (*) 0.3 - 1.2 mg/dL   GFR calc non Af Amer >90  >90 mL/min   GFR calc Af Amer >90  >90 mL/min   Ct Abdomen Pelvis W  Contrast  03/04/2012  *RADIOLOGY REPORT*  Clinical Data: Nausea, vomiting and diarrhea.  Abdominal pain.  CT ABDOMEN AND PELVIS WITH CONTRAST  Technique:  Multidetector CT imaging of the abdomen and pelvis was performed following the standard protocol during bolus administration of intravenous contrast.  Contrast: 80mL OMNIPAQUE IOHEXOL 300 MG/ML  SOLN  Comparison: None.  Findings: Lung bases are clear.  No pleural or pericardial fluid.  The liver parenchyma is normal.  No calcified gallstones.  The spleen is normal.  The pancreas is normal.  The adrenal glands are normal.  The kidneys are normal.  The aorta and IVC are normal. Bladder is distended.  There is a large amount of gas and fluid in the stomach.  There is a moderate amount of gas and fecal matter in the colon.  There is a moderate amount of small bowel gas. Findings could go along with ileus.  I do not identify any free air.  No acute bony finding.  There is chronic spinal curvature.  IMPRESSION: Large amount of gas in the intestine, possibly related to ileus. No sign of free air or definable obstruction.  No acute organ pathology is evident.  Distended bladder.   Original Report Authenticated By: Paulina Fusi, M.D.    No results found.   No diagnosis found. 1. Chronic diarrhea 2. N, V - resolved 3. Leukocytosis   MDM  CT scan negative. The patient has not had any further vomiting. He reports feeling improved. Tolerating PO fluids.         Rodena Medin, PA-C 03/04/12 1823

## 2012-03-06 ENCOUNTER — Encounter: Payer: Self-pay | Admitting: Internal Medicine

## 2012-03-06 ENCOUNTER — Ambulatory Visit (INDEPENDENT_AMBULATORY_CARE_PROVIDER_SITE_OTHER): Payer: Medicaid Other | Admitting: Internal Medicine

## 2012-03-06 ENCOUNTER — Telehealth: Payer: Self-pay | Admitting: Internal Medicine

## 2012-03-06 VITALS — BP 120/78 | HR 68 | Ht 65.0 in | Wt 87.0 lb

## 2012-03-06 DIAGNOSIS — R140 Abdominal distension (gaseous): Secondary | ICD-10-CM

## 2012-03-06 DIAGNOSIS — R143 Flatulence: Secondary | ICD-10-CM

## 2012-03-06 DIAGNOSIS — R197 Diarrhea, unspecified: Secondary | ICD-10-CM

## 2012-03-06 MED ORDER — METRONIDAZOLE 250 MG PO TABS: 250.0000 mg | ORAL_TABLET | Freq: Four times a day (QID) | ORAL | Status: DC

## 2012-03-06 NOTE — Patient Instructions (Addendum)
Stop taking the Lomotil, and begin taking metronidazole as directed. Hold the glycopyralate for now. We will call for reassessment and further recommendations on Monday. CC:  Marletta Lor NP

## 2012-03-06 NOTE — Telephone Encounter (Signed)
Stella at [facility] notified that no diagnosis of infection that we know of, but if they could return the stool sample as soon as possible that would help.  They are still trying to collect the stool studies and will return it as soon as possible.

## 2012-03-06 NOTE — Progress Notes (Signed)
  Subjective:    Patient ID: Alan Benson, male    DOB: [birth year], 36 y.o.   MRN: [id number]  HPI Alan Benson returns after he had to go to ED with nausea and vomiting. Diarrhea still a problem with 3-4 watery stools/day and some abdominal pain. Nausea and vomiting was self-limited. CT scan showed gas filled colon, bowel Has not completed the C diff test and fecal lactoferrin.   Review of Systems Difficulty urinating    Objective:   Physical Exam NAD  Abdomen mildly distended, soft and nontender, tympanitic in upper abdomen, especially left.  Lab Results  Component Value Date   WBC 17.6* 03/04/2012   HGB 14.9 03/04/2012   HCT 42.0 03/04/2012   MCV 84.8 03/04/2012   PLT 254 03/04/2012     Chemistry      Component Value Date/Time   NA 141 03/04/2012 1155   K 4.1 03/04/2012 1155   CL 99 03/04/2012 1155   CO2 19 03/04/2012 1155   BUN 19 03/04/2012 1155   CREATININE 0.98 03/04/2012 1155      Component Value Date/Time   CALCIUM 9.4 03/04/2012 1155   ALKPHOS 86 03/04/2012 1155   AST 24 03/04/2012 1155   ALT 15 03/04/2012 1155   BILITOT 1.7* 03/04/2012 1155          Assessment & Plan:   1. Diarrhea  - persistent - will try empiric metronidazole and call to recheck on Monday 11/25 - if not getting better then observation admit for colonoscopy - he may want family notified and to be there  2. Abdominal distention  - multifactorial but suspect he is overtreated with glycopyrrolate and Lomotil - will stop lomotil and hold glycopyrrolate - this is also contributing to bladder problems and likely had something to do with his nausea and vomiting

## 2012-03-09 ENCOUNTER — Other Ambulatory Visit: Payer: Medicaid Other

## 2012-03-09 DIAGNOSIS — K529 Noninfective gastroenteritis and colitis, unspecified: Secondary | ICD-10-CM

## 2012-03-10 ENCOUNTER — Telehealth: Payer: Self-pay

## 2012-03-10 NOTE — Telephone Encounter (Signed)
Message copied by [place], Leyna Vanderkolk E on Tue Mar 10, 2012  4:36 PM ------      Message from: Iva Boop      Created: Tue Mar 10, 2012  2:37 PM       OK      Stay the course on Abx            Initial stool test was negative for inflammation but antibiotics can still help            I need to see him week of 9/9, sooner prn                  ----- Message -----         From: Nil Bolser E [place], CMA         Sent: 03/10/2012  11:57 AM           To: Iva Boop, MD            Alan Benson from [facility] at # [phone] called me back with updated status on Alan Benson.  BP 125/80, T- 97.9, stomach "grumpy", one episode of loose, some what watery stool , drinking and eating moderately today.

## 2012-03-10 NOTE — Telephone Encounter (Signed)
LM that C. Diff test negative and for them to call me back to set up follow up appointment.  He is to continue his antibotics.

## 2012-03-10 NOTE — Telephone Encounter (Signed)
Message copied by [place], Keary Hanak E on Tue Mar 10, 2012  4:42 PM ------      Message from: Iva Boop      Created: Tue Mar 10, 2012  3:50 PM      Regarding: results       C diff negative            Let him and [facility] know            Also - let's check in with him again next Monday

## 2012-03-10 NOTE — Telephone Encounter (Signed)
LM at [facility] house [phone] that C. Diff Neg. , stay on antibiotics, and we need to make him appointment week of 12/9 or sooner .  Depending how he is doing Dr. Leone Payor  May need to do a procedure on him at the hospital first week of Dec.

## 2012-03-11 ENCOUNTER — Telehealth: Payer: Self-pay

## 2012-03-11 NOTE — Telephone Encounter (Signed)
Alan Benson called and I made appointment for Alan Benson on 03/26/12 per Dr. Leone Payor for follow-up on diarrhea.  Today Alan Benson said he is doing well no complaints, no diarrhea.  Informed her of our holiday hours and that a Dr. will be on call if they need [place].  I will call [name] on 03/16/12 and check on [name].

## 2012-03-18 ENCOUNTER — Telehealth: Payer: Self-pay

## 2012-03-18 NOTE — Telephone Encounter (Signed)
Sounds improved Complete antibiotics See me in late Dec or Jan Call back sooner if problems  If they think [place] disagrees with this assessment then let me know

## 2012-03-18 NOTE — Telephone Encounter (Signed)
Per Bunnie Pion in charge today at [employer] has no temp., one loose stool yest. None today so far, occ. Stomach ache, moderate appetite.  I told her Dr. Leone Payor wanted update and if any new orders I will call her back # [phone].

## 2012-03-19 NOTE — Telephone Encounter (Signed)
LM for [name] to call me back to discuss medicines.

## 2012-03-19 NOTE — Telephone Encounter (Signed)
Just spoke to [facility] at [place], Merchandiser, retail today.  She said Montez has finished his antibiotics and he has appointment already set up for 03/26/12.  Misty Stanley said he is complaining of stomach discomfort, cramping.  She wants to know if there is something you could suggest to help this. If rx needs to be sent to [facility] fax# [phone] which I can do for you.

## 2012-03-19 NOTE — Telephone Encounter (Signed)
He can retry glycopyrrolate - I think we held it Would start with just once a day since he had an ileus problem and urinary retention on that and Lomotil before

## 2012-03-20 NOTE — Telephone Encounter (Signed)
Spoke with Francena Hanly at [facility] , gave verbal orders to restart the generic Robinul one a day. Faxed order over for their charting.

## 2012-03-20 NOTE — Telephone Encounter (Signed)
Left another message for [name] to call back since didn't hear back from them yesterday.

## 2012-03-26 ENCOUNTER — Ambulatory Visit (INDEPENDENT_AMBULATORY_CARE_PROVIDER_SITE_OTHER): Payer: Medicaid Other | Admitting: Internal Medicine

## 2012-03-26 ENCOUNTER — Encounter: Payer: Self-pay | Admitting: Internal Medicine

## 2012-03-26 VITALS — BP 140/100 | HR 71

## 2012-03-26 DIAGNOSIS — K52832 Lymphocytic colitis: Secondary | ICD-10-CM

## 2012-03-26 DIAGNOSIS — R197 Diarrhea, unspecified: Secondary | ICD-10-CM

## 2012-03-26 DIAGNOSIS — K5289 Other specified noninfective gastroenteritis and colitis: Secondary | ICD-10-CM

## 2012-03-26 MED ORDER — LOPERAMIDE HCL 2 MG PO TABS: 4.0000 mg | ORAL_TABLET | Freq: Two times a day (BID) | ORAL | Status: DC

## 2012-03-26 MED ORDER — PREDNISONE 10 MG PO TABS: 40.0000 mg | ORAL_TABLET | Freq: Every day | ORAL | Status: DC

## 2012-03-26 NOTE — Progress Notes (Signed)
Patient ID: Alan Benson, male   DOB: 12-Apr-1976, 36 y.o.   MRN: [id number]  [name] returns with worsening diarrhea. I had put him on empiric metronidazole and he was noting improvement in the first part of treatment course but over the past several days she's had loose watery diarrhea. He's had a stool while here today. [facility] provider [facility] reports that things have been worsening over the past several days. [name] would like to use a Brat diet. He denies abdominal pain. He is on glycopyrrolate but no other antimotility agents.  Medications, allergies, past medical history, past surgical history, family history and social history are reviewed and updated in the EMR.  Is not examined today, he is in his wheelchair. He looks about the same.  Assessment and plan  1. Lymphocytic colitis   2. Diarrhea    1. Infectious workup has been negative to date. This is C. differential culture also looking for inflammation of lactoferrin. I was treating him on the basis of possible bacterial overgrowth. Unfortunately he is worse. 2. Will at loperamide back. I think he can take this regularly with added when necessary doses. 3. Another trial of prednisone given the history of lymphocytic colitis, 40 mg daily 4. Brat diet is appropriate  5. Unless there is a remarkable recovery over the next few days, and we will call him on Monday to reassess, I think he is going to need a colonoscopy. This will likely require observation admission to the hospital to prep given his severe spastic cerebral palsy.  CC: Marletta Lor, NP

## 2012-03-26 NOTE — Patient Instructions (Addendum)
Please start the prednisone and loperamide today. We will call 12/16 to recheck. We will work on arranging a colonoscopy as discussed.  Thank you for choosing me and [facility].  Iva Boop, M.D., [facility]

## 2012-03-30 ENCOUNTER — Telehealth: Payer: Self-pay

## 2012-03-30 NOTE — Telephone Encounter (Signed)
LM to call me back with health status update.

## 2012-04-02 ENCOUNTER — Telehealth: Payer: Self-pay

## 2012-04-02 NOTE — Telephone Encounter (Signed)
Called [facility] and got recording , LM for them to call us back asap to update [place].

## 2012-04-02 NOTE — Telephone Encounter (Signed)
Phone seems out of order, just static when you dial the number for [facility] [phone].  Will try and call back later today , Dr. Leone Payor needs updated status on how he is doing.  LM 03/30/12 and have not heard back.

## 2012-04-03 NOTE — Telephone Encounter (Signed)
LM for them to call me back regarding Euell's health status.  We have been unable to reach them, I left on the message that Dr. Leone Payor will be out next week and left our office hours up until Christmas.

## 2012-04-05 NOTE — Telephone Encounter (Signed)
Ok If he is improved stay on prednisone 40 mg daily for 2 weeks and then go to 30 mg daily Ok to text me/page me if ? Over vacation

## 2012-04-06 ENCOUNTER — Telehealth: Payer: Self-pay

## 2012-04-06 NOTE — Telephone Encounter (Signed)
Opened in error

## 2012-04-06 NOTE — Telephone Encounter (Signed)
Spoke to [place] at [facility].  She reports Alan Benson has not had a bowel movement in a week.  Spoke with Dr. Leone Payor and he said to change Imodium to prn order.  Ok to have Ensure Clear to drink.  Change Prednisone to 30mg . Every day. Ok to advance his diet. [name] needs January appointment.  Called [facility] and LM that I was faxing over these orders and for them to call back with any questions.

## 2012-04-07 ENCOUNTER — Telehealth: Payer: Self-pay

## 2012-04-07 NOTE — Telephone Encounter (Signed)
Lisa Pennix from [facility] called, their fax was messed up.  Therefore I re-faxed orders over for [facility], scanned order sheet in.  Specifics given with Lennox Solders help yesterday on these orders.

## 2012-04-13 ENCOUNTER — Telehealth: Payer: Self-pay

## 2012-04-13 NOTE — Telephone Encounter (Signed)
Called and left message to call us back regarding how he is doing today regarding his diarrhea.

## 2012-04-23 ENCOUNTER — Telehealth: Payer: Self-pay | Admitting: Internal Medicine

## 2012-04-23 NOTE — Telephone Encounter (Signed)
Left message for patient to call back  

## 2012-04-23 NOTE — Telephone Encounter (Signed)
Phone is busy, I will continue to try and reach the patient at [facility]

## 2012-04-27 NOTE — Telephone Encounter (Signed)
Unable to reach anyone at [facility] house I have left several messages.  They are asked to call back if there are any additional questions.

## 2012-04-30 ENCOUNTER — Telehealth: Payer: Self-pay

## 2012-04-30 ENCOUNTER — Encounter: Payer: Self-pay | Admitting: Internal Medicine

## 2012-04-30 ENCOUNTER — Ambulatory Visit (INDEPENDENT_AMBULATORY_CARE_PROVIDER_SITE_OTHER): Payer: Medicaid Other | Admitting: Internal Medicine

## 2012-04-30 VITALS — BP 140/100 | HR 80

## 2012-04-30 DIAGNOSIS — K589 Irritable bowel syndrome without diarrhea: Secondary | ICD-10-CM

## 2012-04-30 DIAGNOSIS — K5289 Other specified noninfective gastroenteritis and colitis: Secondary | ICD-10-CM

## 2012-04-30 DIAGNOSIS — K52832 Lymphocytic colitis: Secondary | ICD-10-CM

## 2012-04-30 MED ORDER — PREDNISONE 10 MG PO TABS: 20.0000 mg | ORAL_TABLET | Freq: Every day | ORAL | Status: DC

## 2012-04-30 MED ORDER — MESALAMINE 1.2 G PO TBEC: 2.4000 g | DELAYED_RELEASE_TABLET | Freq: Every day | ORAL | Status: DC

## 2012-04-30 NOTE — Telephone Encounter (Signed)
[employer] information systems officer faxed a request to change Lialda rx , not covered by insurance.  Per Darcey Nora, RN samples have been put up front and Stella at [facility] notified to pick up these to use until Dr. Leone Payor back in town to advice on which of the covered drugs he prefers:  Apriso or Pentasa.  [facility] also informed of what we are doing.  This message will be routed to Dr. Leone Payor to advise.

## 2012-04-30 NOTE — Progress Notes (Signed)
  Subjective:    Patient ID: Alan Benson, male    DOB: October 31, 1975, 37 y.o.   MRN: [id number]  HPI Oral is here with one of his caregivers from [facility]. He is having a much better time with the diarrhea, stools are formed up. He thinks the nutritional supplements may can go somewhat more often but overall he is much better and please with this situation. He is not having any significant abdominal pain or cramps. He only uses Imodium as needed and he is not using the glycopyrrolate. He is currently on 30 mg prednisone daily. Medications, allergies, past medical history, past surgical history, family history and social history are reviewed and updated in the EMR.   Review of Systems As above. He is unable to return to work right now because of his chair not because of his GI problems.    Objective:   Physical Exam Spastic cerebral palsy patient, he has rubor in his face at this time.  Recent CBC and complete metabolic panel are normal except for white count 14,000 which I believe is from the prednisone.     Assessment & Plan:   1. Lymphocytic colitis   2. IBS (irritable bowel syndrome)    1. Immediately improve. Looking back, perhaps it was coming off the mesalamine that was the problem. He was having symptoms when I saw him on that. He had not really responded to a trial of steroids either though they weren't as aggressive as this one. 2. Will taper the prednisone 3. Start Lialda 2.4 g daily 4. Discontinue glycopyrrolate 5. Continue as needed Imodium 6. See me again in March  CC: Marletta Lor, NP

## 2012-04-30 NOTE — Telephone Encounter (Signed)
apriso 1.5 grams every AM That is 4 capsules  So #120 11 refills

## 2012-04-30 NOTE — Patient Instructions (Addendum)
Taper your Prednisone as directed on medicine list.  We are giving you a printed rx for Lialda.  Thank you for choosing me and [facility].  Iva Boop, M.D., [facility]

## 2012-05-01 MED ORDER — MESALAMINE ER 0.375 G PO CP24: ORAL_CAPSULE | ORAL | Status: DC

## 2012-05-01 NOTE — Telephone Encounter (Signed)
Faxed rx for Apriso to [facility] and also to [facility]

## 2012-05-04 ENCOUNTER — Telehealth: Payer: Self-pay

## 2012-05-04 NOTE — Telephone Encounter (Signed)
Spoke to [place] at [facility] and faxed orders over regarding Lialda directions to use up the samples they have from [place] prior to him using the Apriso rx.  Consulted with Darcey Nora, RN, [facility]  He has a months worth of samples.  These are documented under his medicine history section.

## 2012-05-05 ENCOUNTER — Telehealth: Payer: Self-pay

## 2012-05-05 NOTE — Telephone Encounter (Signed)
Francena Hanly from [facility][phone] called to report that she tracked down [place]'s recent stool study results, and will fax them over today.  These will be passed on to Dr. Leone Payor to review.

## 2012-06-06 ENCOUNTER — Emergency Department (HOSPITAL_COMMUNITY): Payer: Medicaid Other

## 2012-06-06 ENCOUNTER — Emergency Department (HOSPITAL_COMMUNITY)
Admission: EM | Admit: 2012-06-06 | Discharge: 2012-06-06 | Disposition: A | Discharge status: Home / Self Care | Payer: Medicaid Other | Attending: Emergency Medicine | Admitting: Emergency Medicine

## 2012-06-06 ENCOUNTER — Encounter (HOSPITAL_COMMUNITY): Payer: Self-pay

## 2012-06-06 DIAGNOSIS — R112 Nausea with vomiting, unspecified: Secondary | ICD-10-CM | POA: Insufficient documentation

## 2012-06-06 DIAGNOSIS — Z8669 Personal history of other diseases of the nervous system and sense organs: Secondary | ICD-10-CM | POA: Insufficient documentation

## 2012-06-06 DIAGNOSIS — Z8739 Personal history of other diseases of the musculoskeletal system and connective tissue: Secondary | ICD-10-CM | POA: Insufficient documentation

## 2012-06-06 DIAGNOSIS — Z8679 Personal history of other diseases of the circulatory system: Secondary | ICD-10-CM | POA: Insufficient documentation

## 2012-06-06 DIAGNOSIS — Z8719 Personal history of other diseases of the digestive system: Secondary | ICD-10-CM | POA: Insufficient documentation

## 2012-06-06 DIAGNOSIS — R1084 Generalized abdominal pain: Secondary | ICD-10-CM | POA: Insufficient documentation

## 2012-06-06 DIAGNOSIS — IMO0002 Reserved for concepts with insufficient information to code with codable children: Secondary | ICD-10-CM | POA: Insufficient documentation

## 2012-06-06 DIAGNOSIS — R197 Diarrhea, unspecified: Secondary | ICD-10-CM | POA: Insufficient documentation

## 2012-06-06 DIAGNOSIS — Z79899 Other long term (current) drug therapy: Secondary | ICD-10-CM | POA: Insufficient documentation

## 2012-06-06 HISTORY — DX: Other muscle spasm: M62.838

## 2012-06-06 HISTORY — DX: Other cerebral palsy: G80.8

## 2012-06-06 HISTORY — DX: Diarrhea, unspecified: R19.7

## 2012-06-06 LAB — CBC WITH DIFFERENTIAL/PLATELET
HCT: 47.7 % (ref 39.0–52.0)
Hemoglobin: 16.6 g/dL (ref 13.0–17.0)
Lymphocytes Relative: 22 % (ref 12–46)
MCHC: 34.8 g/dL (ref 30.0–36.0)
Monocytes Absolute: 1.4 10*3/uL — ABNORMAL HIGH (ref 0.1–1.0)
Monocytes Relative: 10 % (ref 3–12)
Neutro Abs: 9.7 10*3/uL — ABNORMAL HIGH (ref 1.7–7.7)
WBC: 14.6 10*3/uL — ABNORMAL HIGH (ref 4.0–10.5)

## 2012-06-06 LAB — BASIC METABOLIC PANEL
BUN: 10 mg/dL (ref 6–23)
CO2: 22 mEq/L (ref 19–32)
Chloride: 102 mEq/L (ref 96–112)
GFR calc Af Amer: >90 mL/min (ref >90)
Potassium: 3.5 mEq/L (ref 3.5–5.1)

## 2012-06-06 MED ORDER — ONDANSETRON HCL 4 MG PO TABS: 8.0000 mg | ORAL_TABLET | Freq: Four times a day (QID) | ORAL | Status: DC

## 2012-06-06 MED ORDER — ACETAMINOPHEN 325 MG PO TABS
ORAL_TABLET | ORAL | Status: AC
  Administered 2012-06-06: 650 mg via ORAL
  Filled 2012-06-06: qty 2

## 2012-06-06 MED ORDER — ACETAMINOPHEN 325 MG PO TABS
650.0000 mg | ORAL_TABLET | Freq: Once | ORAL | Status: AC
  Administered 2012-06-06: 650 mg via ORAL

## 2012-06-06 MED ORDER — SODIUM CHLORIDE 0.9 % IV SOLN
1000.0000 mL | INTRAVENOUS | Status: DC
  Administered 2012-06-06 (×2): 1000 mL via INTRAVENOUS

## 2012-06-06 MED ORDER — SODIUM CHLORIDE 0.9 % IV BOLUS (SEPSIS)
1000.0000 mL | Freq: Once | INTRAVENOUS | Status: AC
  Administered 2012-06-06: 1000 mL via INTRAVENOUS

## 2012-06-06 NOTE — ED Notes (Signed)
Pt put in scrub pants for transfer home

## 2012-06-06 NOTE — ED Provider Notes (Signed)
History     CSN: [id number]  Arrival date & time 06/06/12  1645   First MD Initiated Contact with Patient 06/06/12 1832      Chief Complaint  Patient presents with  . Nausea  . Emesis  . Diarrhea   level V caveat patient is difficult historian due to cerebral palsy  (Consider location/radiation/quality/duration/timing/severity/associated sxs/prior treatment) HPI History is obtained from Benard Rink, CNA at [facility] patient has had vomiting 3-4 times since yesterday and diarrhea 3 or 4 times since yesterday. Patient complains of diffuse abdominal pain.. No treatment prior to coming here.. Nothing makes symptoms better or worse Past Medical History  Diagnosis Date  . Lymphocytic colitis 12/21/2004  . Failure to thrive in childhood   . Varicose vein   . Cerebral palsy   . Severe flexion contractures of all joints   . Diarrhea   . Mixed spastic athetoid cerebral palsy   . CNS disorder   . Muscle spasms of head and/or neck     Past Surgical History  Procedure Laterality Date  . Colonoscopy  2006    No family history on file.  History  Substance Use Topics  . Smoking status: Never Smoker   . Smokeless tobacco: Never Used  . Alcohol Use: No      Review of Systems  Gastrointestinal: Positive for nausea, vomiting, abdominal pain and diarrhea.    Allergies  Penicillins  Home Medications   Current Outpatient Rx  Name  Route  Sig  Dispense  Refill  . acetaminophen (TYLENOL) 325 MG tablet   Oral   Take 325 mg by mouth every 4 (four) hours as needed for pain or fever. Take 2 tabs by mouth 4 times a day as needed         . baclofen (LIORESAL) 20 MG tablet   Oral   Take 20 mg by mouth 4 (four) times daily.         . chlorhexidine (PERIDEX) 0.12 % solution   Mouth/Throat   Use as directed 15 mLs in the mouth or throat every morning.         . diazepam (VALIUM) 10 MG tablet   Oral   Take 10 mg by mouth at bedtime.         . Folic Acid 0.8 MG CAPS  Oral   Take 0.8 mg by mouth daily.         Marland Kitchen loperamide (IMODIUM A-D) 2 MG tablet   Oral   Take 2 tablets (4 mg total) by mouth 2 (two) times daily. Also take up to 4 more as needed   180 tablet   11   . mesalamine (LIALDA) 1.2 G EC tablet   Oral   Take 2,400 mg by mouth daily with breakfast.         . Multiple Vitamins-Iron (MULTIVITAMINS WITH IRON) TABS   Oral   Take 1 tablet by mouth daily.         . ondansetron (ZOFRAN) 8 MG tablet   Oral   Take 8 mg by mouth every 8 (eight) hours as needed.         . predniSONE (DELTASONE) 10 MG tablet   Oral   Take 10 mg by mouth daily. Take 1 tablet 2/13-2/26, take 1/2 tablet 2/27-3/13, then stop         . predniSONE (DELTASONE) 10 MG tablet   Oral   Take 30 mg by mouth daily.         Marland Kitchen  tiZANidine (ZANAFLEX) 4 MG capsule   Oral   Take 8 mg by mouth 4 (four) times daily.           BP 172/67  Pulse 109  Temp(Src) 100.3 F (37.9 C) (Rectal)  Resp 16  SpO2 96%  Physical Exam  Nursing note and vitals reviewed. Constitutional:  Chronically ill-appearing  HENT:  Head: Normocephalic and atraumatic.  Eyes: Conjunctivae are normal. Pupils are equal, round, and reactive to light.  Neck: Neck supple. No tracheal deviation present. No thyromegaly present.  Cardiovascular: Normal rate and regular rhythm.   No murmur heard. Pulmonary/Chest: Effort normal and breath sounds normal.  Abdominal: Bowel sounds are normal. He exhibits no distension. There is tenderness.  Minimal mild diffuse tenderness  Genitourinary: Penis normal.  Musculoskeletal: Normal range of motion. He exhibits no edema and no tenderness.  Spasticity of all 4 extremities  Neurological: He is alert. Coordination normal.  Skin: Skin is warm and dry. No rash noted.  Psychiatric: He has a normal mood and affect.    ED Course  Procedures (including critical care time)  Labs Reviewed  CBC WITH DIFFERENTIAL - Abnormal; Notable for the following:     WBC 14.6 (*)    Neutro Abs 9.7 (*)    Monocytes Absolute 1.4 (*)    All other components within normal limits  BASIC METABOLIC PANEL   Dg Chest 1 View  06/06/2012  *RADIOLOGY REPORT*  Clinical Data: Nausea.  Vomiting.  Diarrhea.  Cerebral palsy.  CHEST - 1 VIEW  Comparison: None.  Findings: Heart size is normal.  Mediastinal shadows are normal. Lungs are clear.  The vascularity is normal.  No acute bony finding.  IMPRESSION: No active disease   Original Report Authenticated By: Paulina Fusi, M.D.    Dg Abd 1 View  06/06/2012  *RADIOLOGY REPORT*  Clinical Data: Nausea.  Vomiting.  Diarrhea.  Cerebral palsy.  ABDOMEN - 1 VIEW  Comparison: CT 03/04/2012  Findings: There is intestinal gas throughout, but without dilated loops to suggest obstruction.  No worrisome calcifications.  There is curvature of the spine.  Artifact overlies the right abdomen, presumably outside of the patient.  IMPRESSION: Large amount intestinal gas without an obstructive pattern.  This could represent chronic air swallowing or a degree of ileus.   Original Report Authenticated By: Paulina Fusi, M.D.      No diagnosis found. Patient was able to drink 8 ounces of water in the emergency department without vomiting. At 10:10 PM he feels improved X-rays reviewed by me Results for orders placed during the hospital encounter of 06/06/12  BASIC METABOLIC PANEL      Result Value Range   Sodium 142  135 - 145 mEq/L   Potassium 3.5  3.5 - 5.1 mEq/L   Chloride 102  96 - 112 mEq/L   CO2 22  19 - 32 mEq/L   Glucose, Bld 99  70 - 99 mg/dL   BUN 10  6 - 23 mg/dL   Creatinine, Ser 5.62  0.50 - 1.35 mg/dL   Calcium 8.9  8.4 - 13.0 mg/dL   GFR calc non Af Amer >90  >90 mL/min   GFR calc Af Amer >90  >90 mL/min  CBC WITH DIFFERENTIAL      Result Value Range   WBC 14.6 (*) 4.0 - 10.5 K/uL   RBC 5.41  4.22 - 5.81 MIL/uL   Hemoglobin 16.6  13.0 - 17.0 g/dL   HCT 86.5  78.4 - 69.6 %  MCV 88.2  78.0 - 100.0 fL   MCH 30.7  26.0 - 34.0  pg   MCHC 34.8  30.0 - 36.0 g/dL   RDW 16.1  09.6 - 04.5 %   Platelets 310  150 - 400 K/uL   Neutrophils Relative 67  43 - 77 %   Neutro Abs 9.7 (*) 1.7 - 7.7 K/uL   Lymphocytes Relative 22  12 - 46 %   Lymphs Abs 3.2  0.7 - 4.0 K/uL   Monocytes Relative 10  3 - 12 %   Monocytes Absolute 1.4 (*) 0.1 - 1.0 K/uL   Eosinophils Relative 1  0 - 5 %   Eosinophils Absolute 0.1  0.0 - 0.7 K/uL   Basophils Relative 0  0 - 1 %   Basophils Absolute 0.1  0.0 - 0.1 K/uL   Dg Chest 1 View  06/06/2012  *RADIOLOGY REPORT*  Clinical Data: Nausea.  Vomiting.  Diarrhea.  Cerebral palsy.  CHEST - 1 VIEW  Comparison: None.  Findings: Heart size is normal.  Mediastinal shadows are normal. Lungs are clear.  The vascularity is normal.  No acute bony finding.  IMPRESSION: No active disease   Original Report Authenticated By: Paulina Fusi, M.D.    Dg Abd 1 View  06/06/2012  *RADIOLOGY REPORT*  Clinical Data: Nausea.  Vomiting.  Diarrhea.  Cerebral palsy.  ABDOMEN - 1 VIEW  Comparison: CT 03/04/2012  Findings: There is intestinal gas throughout, but without dilated loops to suggest obstruction.  No worrisome calcifications.  There is curvature of the spine.  Artifact overlies the right abdomen, presumably outside of the patient.  IMPRESSION: Large amount intestinal gas without an obstructive pattern.  This could represent chronic air swallowing or a degree of ileus.   Original Report Authenticated By: Paulina Fusi, M.D.      MDM   Symptoms consistent with gastroenteritis in that Ms.Pennix reports that others in the facility have had similar illness Plan discharge back to [facility] house prescription Zofran Diagnosis abdominal pain vomiting and diarrhea     Doug Sou, MD 06/06/12 2212

## 2012-06-06 NOTE — ED Notes (Signed)
WUJ:[id number]<JY> Expected date:06/06/12<BR> Expected time: 4:33 PM<BR> Means of arrival:<BR> Comments:<BR> N/V

## 2012-06-06 NOTE — ED Notes (Signed)
See triage

## 2012-06-06 NOTE — ED Notes (Signed)
Per [facility]- Pt resides at [facility] on [address].  Total assist pt HX of CP.  Staff reports N/V/D x since yesterday evening. Chronic HX of this symptoms.  Pt presents alert and active with care.  Pt presents to ED in prone position..  Vital stable.  Pt repositioned onto back and suctioned offered.

## 2012-06-06 NOTE — ED Notes (Signed)
Did PO challenge per RN request

## 2012-06-06 NOTE — ED Notes (Signed)
At 1.5 hours bednar made aware pt had not been seen my md

## 2012-06-12 ENCOUNTER — Encounter: Payer: Self-pay | Admitting: Physician Assistant

## 2012-06-12 ENCOUNTER — Ambulatory Visit (INDEPENDENT_AMBULATORY_CARE_PROVIDER_SITE_OTHER): Payer: Medicaid Other | Admitting: Physician Assistant

## 2012-06-12 ENCOUNTER — Telehealth: Payer: Self-pay | Admitting: Internal Medicine

## 2012-06-12 VITALS — BP 128/78 | HR 84 | Ht 65.0 in

## 2012-06-12 MED ORDER — PREDNISONE 10 MG PO TABS: ORAL_TABLET | ORAL | Status: DC

## 2012-06-12 NOTE — Patient Instructions (Addendum)
We faxed the new Prednisone prescription to [facility]. Take as directed. Eat a bland diet, no raw vegetables. Stay on the same dose of Apriso.  Take Imodium 1 tablet every morning when you get up.

## 2012-06-12 NOTE — Telephone Encounter (Signed)
Pt went to ER last week for abdominal pain. Today he has had some vomiting, diarrhea, and is c/o abdominal pain again. Pt is requesting to be seen sooner than 1st available. Pt scheduled to see Mike Gip PA today at 2pm. Pt aware of appt date and time.

## 2012-06-12 NOTE — Progress Notes (Signed)
Subjective:    Patient ID: Alan Benson, male    DOB: [birth year], 37 y.o.   MRN: [id number]  HPI [name] is a 37 year old white male known to Dr. Leone Payor. He has cerebral palsy and lives at [facility]. He is wheelchair bound. He has lymphocytic colitis and has been having a recent exacerbation. He was last seen in the office mid-January 2014 and at that time was improving after starting prednisone at 40 mg by mouth daily. He has also been on Apriso. He has tapered gradually over the past 6 weeks down to 10 mg of prednisone daily and says that over the past couple of weeks he has had problems with nausea and occasional episode of vomiting and states that his diarrhea came back last week .  He is having very loose stools which are nonbloody. He complains of generalized abdominal discomfort and cramping. He has not had any documented fever or chills this week but thinks that he had a fever one day last week. He is generally having a couple to 4 bowel movements per day, today so far has just had one bowel movement. He had an ER visit last week because of the nausea and vomiting- was given IV fluids and IV antiemetic. At that time his WBC was elevated at 14.6 however he has been on steroids. He says is actually been eating pretty well the past couple of days and keeping his food down. He has not required any recent antibiotics.    Review of Systems  Constitutional: Positive for appetite change.  HENT: Negative.   Eyes: Negative.   Respiratory: Negative.   Cardiovascular: Negative.   Gastrointestinal: Positive for nausea, abdominal pain and diarrhea.  Endocrine: Negative.   Genitourinary: Negative.   Musculoskeletal: Positive for gait problem.  Skin: Negative.   Allergic/Immunologic: Negative.   Neurological: Positive for speech difficulty.  Hematological: Negative.   Psychiatric/Behavioral: Negative.    Outpatient Prescriptions Prior to Visit  Medication Sig Dispense Refill  . acetaminophen  (TYLENOL) 325 MG tablet Take 325 mg by mouth every 4 (four) hours as needed for pain or fever. Take 2 tabs by mouth 4 times a day as needed      . baclofen (LIORESAL) 20 MG tablet Take 20 mg by mouth 4 (four) times daily.      . chlorhexidine (PERIDEX) 0.12 % solution Use as directed 15 mLs in the mouth or throat every morning.      . diazepam (VALIUM) 10 MG tablet Take 10 mg by mouth at bedtime.      . Folic Acid 0.8 MG CAPS Take 0.8 mg by mouth daily.      . mesalamine (LIALDA) 1.2 G EC tablet Take 2,400 mg by mouth daily with breakfast.      . Multiple Vitamins-Iron (MULTIVITAMINS WITH IRON) TABS Take 1 tablet by mouth daily.      . ondansetron (ZOFRAN) 4 MG tablet Take 2 tablets (8 mg total) by mouth every 6 (six) hours.  16 tablet  0  . ondansetron (ZOFRAN) 8 MG tablet Take 8 mg by mouth every 8 (eight) hours as needed.      . predniSONE (DELTASONE) 10 MG tablet Take 10 mg by mouth daily. Take 1 tablet 2/13-2/26, take 1/2 tablet 2/27-3/13, then stop      . predniSONE (DELTASONE) 10 MG tablet Take 30 mg by mouth daily.      Marland Kitchen tiZANidine (ZANAFLEX) 4 MG capsule Take 8 mg by mouth 4 (four) times daily.      Marland Kitchen  loperamide (IMODIUM A-D) 2 MG tablet Take 2 tablets (4 mg total) by mouth 2 (two) times daily. Also take up to 4 more as needed  180 tablet  11   No facility-administered medications prior to visit.   Allergies  Allergen Reactions  . Penicillins     REACTION: Hives   Patient Active Problem List  Diagnosis  . CEREBRAL PALSY  . QUADRIPLEGIA NOS  . UNDERWEIGHT  . DIARRHEA, CHRONIC  . Lymphocytic colitis   History  Substance Use Topics  . Smoking status: Never Smoker   . Smokeless tobacco: Never Used  . Alcohol Use: No       Objective:   Physical Exam  well-developed white male with cerebral palsy in a wheelchair, quite pleasant but speech difficult to understand. Blood pressure 128/78 pulse 84 height 5 foot 5. HEENT; nontraumatic normocephalic EOMI PERRLA sclera  anicteric,Neck; Supple no JVD, Cardiovascular; regular rate and rhythm with S1-S2 no murmur or gallop, Pulmonary; clear bilaterally, Abdomen; soft bowel sounds are active he has mild bilateral lower quadrant abdominal tenderness, no guarding, or rebound, no palpable mass or hepatosplenomegaly, Rectal; exam not done, Extremities; no clubbing, cyanosis or edema skin warm and dry, Neuro;-patient with cerebral palsy and spasticity, mood and affect appropriate.        Assessment & Plan:  #24  37 year old white male with cerebral palsy followed for lymphocytic colitis. He is having an exacerbation which was controlled with higher doses of steroids and is flaring again with steroid taper. #2 nausea-likely secondary to #1  Plan; increase prednisone to 30 mg by mouth daily Start Imodium one every morning upon arising Zofran 4 mg every 6 hours as needed for nausea Continue Apriso at current dosage- 4 tablets every morning Followup office visit in 7-10 days-if not significantly improved Will pursue other infectious workup, and possible colonoscopy

## 2012-06-12 NOTE — Progress Notes (Signed)
Agree with Ms. Oswald Hillock assessment and plan.  If not better needs obs hospitalization and colonoscopy.  Iva Boop, MD, [facility]

## 2012-06-19 ENCOUNTER — Ambulatory Visit: Payer: Medicaid Other | Admitting: Internal Medicine

## 2012-06-22 ENCOUNTER — Ambulatory Visit: Payer: Medicaid Other | Admitting: Physician Assistant

## 2012-06-24 ENCOUNTER — Telehealth: Payer: Self-pay

## 2012-06-24 NOTE — Telephone Encounter (Signed)
Message copied by Annett Fabian on Wed Jun 24, 2012 10:19 AM ------      Message from: Mike Gip S      Created: Mon Jun 22, 2012  2:51 PM       Lenna Sciara had a follow up appt today for his colitis, and did not show- can you call pt and make sure he is better- he will need to have follow up with me or gessner within  A week or so.. thanks ------

## 2012-06-24 NOTE — Telephone Encounter (Signed)
I spoke with Francena Hanly at [facility].  Patient is doing well tolerating a bland diet and has now advanced to a normal diet.    He has gone to the [facility] tournament.  I have scheduled a follow up for 07/17/12 11:00 with Francena Hanly.  They will call me back if his symptoms return.

## 2012-06-25 NOTE — Telephone Encounter (Signed)
Is  Francena Hanly a new Gastroenterologist? .. havent met her.

## 2012-06-26 ENCOUNTER — Telehealth: Payer: Self-pay

## 2012-06-26 NOTE — Telephone Encounter (Signed)
Left detailed message for nurse in charge, option #4 on the [facility] phone.  Also faxed written copy of new orders to [facility] at # [phone] and got confirmation that they went through.

## 2012-06-26 NOTE — Telephone Encounter (Signed)
Message copied by [place], Shanea Karney E on Fri Jun 26, 2012  1:42 PM ------      Message from: Iva Boop      Created: Fri Jun 26, 2012  8:22 AM      Regarding: RE: follow-up       He should reduce prednisone to 20 mg daily x 2 weeks and then go to 10 mg daily      ----- Message -----         From: Krishon Adkison E [place], CMA         Sent: 06/24/2012   1:37 PM           To: Iva Boop, MD      Subject: RE: follow-up                                            Please see  Sheri's phone note dated 06/24/12 Sir, he's at the [facility] games.       ----- Message -----         From: Iva Boop, MD         Sent: 06/24/2012   1:11 PM           To: Malya Cirillo E [place], CMA      Subject: follow-up                                                Please find out how he is doing      Missed his f/u due to ice so i would like to hear how things are      I may adjust meds             ------

## 2012-07-17 ENCOUNTER — Ambulatory Visit (INDEPENDENT_AMBULATORY_CARE_PROVIDER_SITE_OTHER): Payer: Medicaid Other | Admitting: Internal Medicine

## 2012-07-17 ENCOUNTER — Encounter: Payer: Self-pay | Admitting: Internal Medicine

## 2012-07-17 VITALS — HR 68

## 2012-07-17 DIAGNOSIS — K5289 Other specified noninfective gastroenteritis and colitis: Secondary | ICD-10-CM

## 2012-07-17 DIAGNOSIS — R110 Nausea: Secondary | ICD-10-CM

## 2012-07-17 DIAGNOSIS — R1110 Vomiting, unspecified: Secondary | ICD-10-CM

## 2012-07-17 DIAGNOSIS — K52832 Lymphocytic colitis: Secondary | ICD-10-CM

## 2012-07-17 MED ORDER — OMEPRAZOLE 20 MG PO CPDR: 20.0000 mg | DELAYED_RELEASE_CAPSULE | Freq: Two times a day (BID) | ORAL | Status: DC

## 2012-07-17 NOTE — Patient Instructions (Addendum)
Since he is better with colitis and responded to treatment for known lymphocytic colitis  we will not do a colonoscopy.   Start the omeprazole 20 mg one twice a day, printed rx given to you today.  Thank you for choosing me and [facility].  Iva Boop, M.D., [facility]    PCP is Dr. Shirl Harris

## 2012-07-17 NOTE — Progress Notes (Signed)
  Subjective:    Patient ID: Alan Benson, male    DOB: 22-Oct-1975, 37 y.o.   MRN: [id number]  HPI Alan Benson returns after dealing with diarrhea problems associated with lymphocytic colitis. He is now having formed stools on Apriso and 1 loperamide a day. He has finished a 2 month (approx) course of prednisone.  He is having AM nausea and some urge to vomit or gag. Used to be on omeprazole.  He spends time at work on his abdomen and sleeps on his abdomen. Medications, allergies, past medical history, past surgical history, family history and social history are reviewed and updated in the EMR.  Review of Systems As above    Objective:   Physical Exam Spastic man with CP in wheelchair NAD       Assessment & Plan:  Lymphocytic colitis - improved  Nausea alone  Regurgitation  1. Continue apriso, loperamide 2. Try omeprazole 20 mg before breakfast and supper - ? If GERD causing nausea 3. REV 2 months 4. Call him in 1 month for update 5. Do notthink colonoscopy needed

## 2012-07-24 ENCOUNTER — Telehealth: Payer: Self-pay

## 2012-07-24 MED ORDER — PANTOPRAZOLE SODIUM 40 MG PO TBEC: 40.0000 mg | DELAYED_RELEASE_TABLET | Freq: Every day | ORAL | Status: DC

## 2012-07-24 NOTE — Telephone Encounter (Signed)
Faxed orders back to [facility] at # [phone] with the following information:  Stop omeprazole and try pantoprazole 40mg  one before supper.  If diarrhea not better in one week call back .

## 2012-08-05 ENCOUNTER — Other Ambulatory Visit: Payer: Self-pay

## 2012-08-05 MED ORDER — MESALAMINE ER 0.375 G PO CP24: 375.0000 mg | ORAL_CAPSULE | Freq: Every day | ORAL | Status: DC

## 2012-08-05 NOTE — Telephone Encounter (Signed)
Phoned in Apriso refill to [facility] at # [phone], spoke to the pharmacist [place].

## 2012-08-06 ENCOUNTER — Other Ambulatory Visit: Payer: Self-pay

## 2012-08-06 ENCOUNTER — Telehealth: Payer: Self-pay

## 2012-08-06 MED ORDER — MESALAMINE ER 0.375 G PO CP24: ORAL_CAPSULE | ORAL | Status: DC

## 2012-08-06 NOTE — Telephone Encounter (Signed)
Left message for patient to call back  

## 2012-08-06 NOTE — Telephone Encounter (Signed)
Francena Hanly at [facility] reports that Jaspreet's diarrhea issues are better but still having trouble with nausea before getting out of bed in the AM.  Some clear liquid comes up.  Trouble swallowing his medicines.  Any suggestions or advice you can give her?  I told her that Dr. Leone Payor is out of town and that I would forward this message to his nurse Darcey Nora, [facility].  FYI , today just sent in his Apriso rx which can't be crushed , Francena Hanly was asking if this comes in a liquid.

## 2012-08-06 NOTE — Telephone Encounter (Signed)
I have contacted the pharmacist at [facility] and should be able to take the Apriso pellets out of the capsule.  Discussed with Dr. Rhea Belton and Willette Cluster RNP who agree should try.  I have left a message for Francena Hanly to call back

## 2012-08-06 NOTE — Telephone Encounter (Signed)
Confirmed with Francena Hanly at [facility] that Alan Benson is taking Apriso 4 capsules every AM not 1 every AM as was marked.  Therefore called [facility] at [phone] and gave rx to Fredrik Cove the pharmacist.   Phoned Francena Hanly back and told her this was done.

## 2012-08-07 NOTE — Telephone Encounter (Signed)
I spoke Francena Hanly and explained that the Apriso can be opened, mixed with pudding and taken one time a day.  He needs to make sure that he does not chew the granules.  Try taking the pantoprazole second dose at bedtime rather than dinner.  I have faxed this at [facility] request to [phone]

## 2012-08-10 ENCOUNTER — Telehealth: Payer: Self-pay | Admitting: Internal Medicine

## 2012-08-10 NOTE — Telephone Encounter (Signed)
Patient with abdominal pain nausea and vomiting, he will come in and see Doug Sou, PA tomorrow at 10:45

## 2012-08-11 ENCOUNTER — Encounter: Payer: Self-pay | Admitting: Gastroenterology

## 2012-08-11 ENCOUNTER — Ambulatory Visit (INDEPENDENT_AMBULATORY_CARE_PROVIDER_SITE_OTHER): Payer: Medicaid Other | Admitting: Gastroenterology

## 2012-08-11 ENCOUNTER — Observation Stay (HOSPITAL_COMMUNITY)
Admission: AD | Admit: 2012-08-11 | Discharge: 2012-08-13 | Payer: Medicaid Other | Source: Ambulatory Visit | Attending: Internal Medicine | Admitting: Internal Medicine

## 2012-08-11 ENCOUNTER — Encounter (HOSPITAL_COMMUNITY): Payer: Self-pay

## 2012-08-11 VITALS — BP 160/80

## 2012-08-11 DIAGNOSIS — Z79899 Other long term (current) drug therapy: Secondary | ICD-10-CM | POA: Insufficient documentation

## 2012-08-11 DIAGNOSIS — K5289 Other specified noninfective gastroenteritis and colitis: Secondary | ICD-10-CM

## 2012-08-11 DIAGNOSIS — R112 Nausea with vomiting, unspecified: Secondary | ICD-10-CM | POA: Insufficient documentation

## 2012-08-11 DIAGNOSIS — K52832 Lymphocytic colitis: Secondary | ICD-10-CM

## 2012-08-11 DIAGNOSIS — R197 Diarrhea, unspecified: Secondary | ICD-10-CM

## 2012-08-11 DIAGNOSIS — R109 Unspecified abdominal pain: Secondary | ICD-10-CM

## 2012-08-11 LAB — COMPREHENSIVE METABOLIC PANEL
AST: 30 U/L (ref 0–37)
BUN: 11 mg/dL (ref 6–23)
CO2: 25 mEq/L (ref 19–32)
Chloride: 102 mEq/L (ref 96–112)
Creatinine, Ser: 0.95 mg/dL (ref 0.50–1.35)
GFR calc non Af Amer: >90 mL/min (ref >90)
Glucose, Bld: 103 mg/dL — ABNORMAL HIGH (ref 70–99)
Total Bilirubin: 1 mg/dL (ref 0.3–1.2)

## 2012-08-11 LAB — CBC
HCT: 43.1 % (ref 39.0–52.0)
Hemoglobin: 14.9 g/dL (ref 13.0–17.0)
MCV: 86.5 fL (ref 78.0–100.0)
RBC: 4.98 MIL/uL (ref 4.22–5.81)
WBC: 9.1 10*3/uL (ref 4.0–10.5)

## 2012-08-11 MED ORDER — ONDANSETRON HCL 4 MG PO TABS: 4.0000 mg | ORAL_TABLET | Freq: Four times a day (QID) | ORAL | Status: DC | PRN

## 2012-08-11 MED ORDER — PANTOPRAZOLE SODIUM 40 MG PO TBEC
40.0000 mg | DELAYED_RELEASE_TABLET | Freq: Every day | ORAL | Status: DC
  Administered 2012-08-12 – 2012-08-13 (×2): 40 mg via ORAL
  Filled 2012-08-11 (×2): qty 1

## 2012-08-11 MED ORDER — METOCLOPRAMIDE HCL 5 MG/ML IJ SOLN
10.0000 mg | Freq: Once | INTRAMUSCULAR | Status: AC
  Administered 2012-08-11: 10 mg via INTRAVENOUS
  Filled 2012-08-11: qty 2

## 2012-08-11 MED ORDER — ACETAMINOPHEN 650 MG RE SUPP: 650.0000 mg | Freq: Four times a day (QID) | RECTAL | Status: DC | PRN

## 2012-08-11 MED ORDER — PEG-KCL-NACL-NASULF-NA ASC-C 100 G PO SOLR
0.5000 | Freq: Once | ORAL | Status: DC
  Filled 2012-08-11: qty 1

## 2012-08-11 MED ORDER — BACLOFEN 20 MG PO TABS
20.0000 mg | ORAL_TABLET | Freq: Four times a day (QID) | ORAL | Status: DC
  Administered 2012-08-11 – 2012-08-13 (×8): 20 mg via ORAL
  Filled 2012-08-11 (×11): qty 1

## 2012-08-11 MED ORDER — DIAZEPAM 5 MG PO TABS
10.0000 mg | ORAL_TABLET | Freq: Every day | ORAL | Status: DC
  Administered 2012-08-11 – 2012-08-12 (×2): 10 mg via ORAL
  Filled 2012-08-11 (×2): qty 2

## 2012-08-11 MED ORDER — TAB-A-VITE/IRON PO TABS
1.0000 | ORAL_TABLET | Freq: Every day | ORAL | Status: DC
  Administered 2012-08-12 – 2012-08-13 (×2): 1 via ORAL
  Filled 2012-08-11 (×3): qty 1

## 2012-08-11 MED ORDER — METOCLOPRAMIDE HCL 5 MG/ML IJ SOLN
10.0000 mg | Freq: Once | INTRAMUSCULAR | Status: AC
  Administered 2012-08-12: 10 mg via INTRAVENOUS
  Filled 2012-08-11: qty 2

## 2012-08-11 MED ORDER — FOLIC ACID 1 MG PO TABS
1.0000 mg | ORAL_TABLET | Freq: Every day | ORAL | Status: DC
  Administered 2012-08-12 – 2012-08-13 (×2): 1 mg via ORAL
  Filled 2012-08-11 (×3): qty 1

## 2012-08-11 MED ORDER — SODIUM CHLORIDE 0.9 % IJ SOLN
3.0000 mL | Freq: Two times a day (BID) | INTRAMUSCULAR | Status: DC
  Administered 2012-08-11: 3 mL via INTRAVENOUS

## 2012-08-11 MED ORDER — SODIUM CHLORIDE 0.9 % IV SOLN: 250.0000 mL | INTRAVENOUS | Status: DC | PRN

## 2012-08-11 MED ORDER — CHLORHEXIDINE GLUCONATE 0.12 % MT SOLN
15.0000 mL | Freq: Every morning | OROMUCOSAL | Status: DC
  Filled 2012-08-11 (×3): qty 15

## 2012-08-11 MED ORDER — MESALAMINE ER 0.375 G PO CP24: 1.5000 g | ORAL_CAPSULE | Freq: Every day | ORAL | Status: DC

## 2012-08-11 MED ORDER — PEG-KCL-NACL-NASULF-NA ASC-C 100 G PO SOLR
0.5000 | Freq: Once | ORAL | Status: AC
  Administered 2012-08-12: 50 g via ORAL

## 2012-08-11 MED ORDER — ACETAMINOPHEN 325 MG PO TABS: 650.0000 mg | ORAL_TABLET | Freq: Four times a day (QID) | ORAL | Status: DC | PRN

## 2012-08-11 MED ORDER — PEG-KCL-NACL-NASULF-NA ASC-C 100 G PO SOLR: 1.0000 | Freq: Once | ORAL | Status: DC

## 2012-08-11 MED ORDER — SODIUM CHLORIDE 0.9 % IJ SOLN: 3.0000 mL | INTRAMUSCULAR | Status: DC | PRN

## 2012-08-11 MED ORDER — ONDANSETRON HCL 4 MG/2ML IJ SOLN: 4.0000 mg | Freq: Four times a day (QID) | INTRAMUSCULAR | Status: DC | PRN

## 2012-08-11 MED ORDER — POTASSIUM CHLORIDE IN NACL 20-0.9 MEQ/L-% IV SOLN
INTRAVENOUS | Status: DC
  Administered 2012-08-11 – 2012-08-12 (×2): via INTRAVENOUS
  Filled 2012-08-11 (×3): qty 1000

## 2012-08-11 MED ORDER — PEG-KCL-NACL-NASULF-NA ASC-C 100 G PO SOLR
0.5000 | Freq: Once | ORAL | Status: AC
  Administered 2012-08-11: 50 g via ORAL
  Filled 2012-08-11: qty 1

## 2012-08-11 MED ORDER — TIZANIDINE HCL 4 MG PO TABS
8.0000 mg | ORAL_TABLET | Freq: Four times a day (QID) | ORAL | Status: DC
  Administered 2012-08-11 – 2012-08-13 (×8): 8 mg via ORAL
  Filled 2012-08-11 (×11): qty 2

## 2012-08-11 NOTE — Progress Notes (Signed)
08/11/2012 Alan Benson [id number] 1976/03/14   History of Present Illness:  Patient is a pleasant 37 year old male with CP who is a patient of Dr. Marvell Fuller.  He has lymphocytic colitis and is on apriso 4 caps daily along with Imodium prn.  Had been on prednisone in the past as well.  Was doing well at his last visit earlier this month but says that the diarrhea has returned again.  3 times per day.  Has intermittent abdominal pains as well.  Nausea each morning upon waking up.  Vomits clear stuff at times and vomits after taking his pills.  His PCP suggested that he should have a colonoscopy.  Current Medications, Allergies, Past Medical History, Past Surgical History, Family History and Social History were reviewed in [employer] record.   Physical Exam: BP 160/80 General:  Young male with CP in no acute distress Head: Normocephalic and atraumatic Eyes:  sclerae anicteric, conjunctiva pink  Ears: Normal auditory acuity Lungs: Clear throughout to auscultation Heart: Regular rate and rhythm Abdomen: Soft, non-distended.  Normal bowel sounds.  Mild upper abdominal TTP without R/R/G. Musculoskeletal:  Has spastic contractures on all extremities. Extremities: No edema  Neurological: Alert oriented x 4, grossly nonfocal Psychological:  Alert and cooperative. Normal mood and affect  Assessment and Recommendations: -Diarrhea:  Has lymphocytic colitis.  On apriso four caps daily and Imodium prn -Nausea and vomiting -Abdominal pains  *Spoke with Dr. Leone Payor.  Patient will have EGD and colonoscopy on Thursday 5/1.  Will be admitted to [facility] hospital for prep tomorrow 4/31.

## 2012-08-11 NOTE — Patient Instructions (Addendum)
You have been scheduled for an endoscopy and colonoscopy at [facility] on April 30th 2014. Please follow the written instructions given to you at your visit today. You will be admitted 08/11/12 to get prepped. You may go ahead to [facility] admitting upon leaving here.  You will be on [address]. If you use inhalers (even only as needed), please bring them with you on the day of your procedure. Your physician has requested that you go to www.startemmi.com and enter the access code given to you at your visit today. This web site gives a general overview about your procedure. However, you should still follow specific instructions given to you by our office regarding your preparation for the procedure.  Thank you for choosing me and [facility].  Doug Sou, PA-C

## 2012-08-11 NOTE — H&P (Signed)
Primary Care Physician:  No primary provider on file. Primary Gastroenterologist:  Dr. Leone Payor  CHIEF COMPLAINT:  Diarrhea, abdominal pain, nausea and vomiting  HPI: Alan Benson is a 37 y.o. male with CP who is a patient of Dr. Marvell Fuller. He has lymphocytic colitis and is on apriso 4 caps daily along with Imodium prn. Had been on prednisone in the past as well. Was doing well at his last visit earlier this month but says that the diarrhea has returned again. 3 times per day. Has intermittent abdominal pains as well. Nausea each morning upon waking up. Vomits clear stuff at times and vomits after taking his pills. His PCP suggested that he should have a colonoscopy.  It was decided that we should admit him to [facility] for preparation for EGD and colonoscopy for evaluation of his symptoms.   Past Medical History  Diagnosis Date  . Lymphocytic colitis 12/21/2004  . Failure to thrive in childhood   . Varicose vein   . Cerebral palsy   . Severe flexion contractures of all joints   . Diarrhea   . Mixed spastic athetoid cerebral palsy   . CNS disorder   . Muscle spasms of head and/or neck     Past Surgical History  Procedure Laterality Date  . Colonoscopy  2006    Prior to Admission medications   Medication Sig Start Date End Date Taking? Authorizing Provider  acetaminophen (TYLENOL) 325 MG tablet Take 325 mg by mouth every 4 (four) hours as needed for pain or fever. Take 2 tabs by mouth 4 times a day as needed    Historical Provider, MD  baclofen (LIORESAL) 20 MG tablet Take 20 mg by mouth 4 (four) times daily.    Historical Provider, MD  chlorhexidine (PERIDEX) 0.12 % solution Use as directed 15 mLs in the mouth or throat every morning.    Historical Provider, MD  diazepam (VALIUM) 10 MG tablet Take 10 mg by mouth at bedtime.    Historical Provider, MD  Folic Acid 0.8 MG CAPS Take 0.8 mg by mouth daily.    Historical Provider, MD  loperamide (IMODIUM) 2 MG capsule Take 2 mg by mouth 4 (four)  times daily as needed for diarrhea or loose stools.    Historical Provider, MD  mesalamine (APRISO) 0.375 G 24 hr capsule Take 4 capsules (1.5mg ) by mouth in the morning.  Do Not Crush. 08/06/12   Iva Boop, MD  Multiple Vitamins-Iron (MULTIVITAMINS WITH IRON) TABS Take 1 tablet by mouth daily.    Historical Provider, MD  ondansetron (ZOFRAN) 8 MG tablet Take 8 mg by mouth every 8 (eight) hours as needed.    Historical Provider, MD  pantoprazole (PROTONIX) 40 MG tablet Take 1 tablet (40 mg total) by mouth daily. 07/24/12   Iva Boop, MD  tiZANidine (ZANAFLEX) 4 MG capsule Take 8 mg by mouth 4 (four) times daily.    Historical Provider, MD    Current Facility-Administered Medications  Medication Dose Route Frequency Provider Last Rate Last Dose  . 0.9 %  sodium chloride infusion  250 mL Intravenous PRN Jessica D. Zehr, PA-C      . 0.9 % NaCl with KCl 20 mEq/ L  infusion   Intravenous Continuous Jessica D. Zehr, PA-C      . acetaminophen (TYLENOL) tablet 650 mg  650 mg Oral Q6H PRN Princella Pellegrini. Zehr, PA-C       Or  . acetaminophen (TYLENOL) suppository 650 mg  650 mg Rectal  Q6H PRN Princella Pellegrini. Zehr, PA-C      . baclofen (LIORESAL) tablet 20 mg  20 mg Oral QID Jessica D. Zehr, PA-C      . chlorhexidine (PERIDEX) 0.12 % solution 15 mL  15 mL Mouth/Throat q morning - 10a Jessica D. Zehr, PA-C      . diazepam (VALIUM) tablet 10 mg  10 mg Oral QHS Jessica D. Zehr, PA-C      . Folic Acid CAPS 0.8 mg  0.8 mg Oral Daily Jessica D. Zehr, PA-C      . mesalamine (APRISO) 24 hr capsule 1.5 g  1.5 g Oral Daily Jessica D. Zehr, PA-C      . multivitamins with iron tablet 1 tablet  1 tablet Oral Daily Jessica D. Zehr, PA-C      . ondansetron (ZOFRAN) tablet 4 mg  4 mg Oral Q6H PRN Princella Pellegrini. Zehr, PA-C       Or  . ondansetron (ZOFRAN) injection 4 mg  4 mg Intravenous Q6H PRN Jessica D. Zehr, PA-C      . pantoprazole (PROTONIX) EC tablet 40 mg  40 mg Oral Daily Jessica D. Zehr, PA-C      . sodium chloride  0.9 % injection 3 mL  3 mL Intravenous Q12H Jessica D. Zehr, PA-C      . sodium chloride 0.9 % injection 3 mL  3 mL Intravenous PRN Jessica D. Zehr, PA-C      . tiZANidine (ZANAFLEX) capsule 8 mg  8 mg Oral QID Jessica D. Zehr, PA-C        Allergies as of 08/11/2012 - Review Complete 08/11/2012  Allergen Reaction Noted  . Penicillins  02/16/2007    History reviewed. No pertinent family history.  History   Social History  . Marital Status: Single    Spouse Name: N/A    Number of Children: 0  . Years of Education: N/A   Occupational History  . Disabled    Social History Main Topics  . Smoking status: Never Smoker   . Smokeless tobacco: Never Used  . Alcohol Use: No  . Drug Use: No  . Sexually Active: No   Other Topics Concern  . Not on file   Social History Narrative  . No narrative on file    Review of Systems: Ten point ROS is O/W negative except as mentioned in HPI.  Physical Exam: Vital signs in last 24 hours: BP: (160)/(80) 160/80 mmHg (04/29 1047) Last BM Date: 08/11/12 General: Young male with CP in no acute distress  Head: Normocephalic and atraumatic  Eyes: sclerae anicteric, conjunctiva pink  Ears: Normal auditory acuity  Lungs: Clear throughout to auscultation  Heart: Regular rate and rhythm  Abdomen: Soft, non-distended. Normal bowel sounds. Mild upper abdominal TTP without R/R/G.  Musculoskeletal: Has spastic contractures on all extremities.  Extremities: No edema  Neurological: Alert oriented x 4, grossly nonfocal  Psychological: Alert and cooperative. Normal mood and affect    Impression / Plan:   -Diarrhea: Has lymphocytic colitis. On apriso four caps daily and Imodium prn  -Nausea and vomiting  -Abdominal pains   *Spoke with Dr. Leone Payor. Patient will have EGD and colonoscopy tomorrow 5/30.  Admit today to [facility] for prep.  Will restart home meds.           LOS: 0 days   ZEHR, JESSICA D.  08/11/2012, 1:12 PM   [facility]  Attending  I have also seen and assessed the patient and agree with the  above note. He has had chronic and intermittent diarrhea problems despite Tx for microscopic colitis and abdominal pain and nausea, regurgitation and vomiting.  Plan for EGD and colonoscopy tomorrow. I have previously discussed this with him and we had planned for this if recurrent problems.  Iva Boop, MD, [facility] [phone] (pager) 08/11/2012 3:48 PM

## 2012-08-11 NOTE — Care Management (Addendum)
CARE MANAGEMENT NOTE 08/11/2012  Patient:  Alan Benson, Alan Benson   Account Number:  [id number]  Date Initiated:  08/11/2012  Documentation initiated by:  Sayid Moll  Subjective/Objective Assessment:   37 yo male admitted with lymphocytic colitis. Planned colonoscopy 08/12/12.     Action/Plan:   Patient is from [facility]   Anticipated DC Date:     Anticipated DC Plan:        DC Planning Services  CM consult      Choice offered to / List presented to:  NA   DME arranged  NA      DME agency  NA     HH arranged  NA      HH agency  NA   Status of service:  In process, will continue to follow Medicare Important Message given?   (If response is "NO", the following Medicare IM given date fields will be blank) Date Medicare IM given:   Date Additional Medicare IM given:    Discharge Disposition:    Per UR Regulation:  Reviewed for med. necessity/level of care/duration of stay  If discussed at Long Length of Stay Meetings, dates discussed:    Comments:  08/11/12 1412 Mallorie Norrod,RN,BSN [phone] No needs assessed at this time. Pt is from [facility], dc plan includes discharge back to facility at time of discharge.

## 2012-08-12 ENCOUNTER — Encounter (HOSPITAL_COMMUNITY): Payer: Self-pay | Admitting: Anesthesiology

## 2012-08-12 ENCOUNTER — Encounter (HOSPITAL_COMMUNITY): Payer: Self-pay

## 2012-08-12 ENCOUNTER — Encounter (HOSPITAL_COMMUNITY)
Admission: AD | Disposition: A | Discharge status: Other Acute Inpatient Hospital | Payer: Self-pay | Source: Ambulatory Visit | Attending: Internal Medicine

## 2012-08-12 ENCOUNTER — Observation Stay (HOSPITAL_COMMUNITY): Payer: Medicaid Other | Admitting: Anesthesiology

## 2012-08-12 HISTORY — PX: COLONOSCOPY WITH PROPOFOL: SHX5780

## 2012-08-12 HISTORY — PX: ESOPHAGOGASTRODUODENOSCOPY (EGD) WITH PROPOFOL: SHX5813

## 2012-08-12 SURGERY — ESOPHAGOGASTRODUODENOSCOPY (EGD) WITH PROPOFOL
Anesthesia: Monitor Anesthesia Care

## 2012-08-12 SURGERY — COLONOSCOPY WITH PROPOFOL
Anesthesia: Monitor Anesthesia Care

## 2012-08-12 MED ORDER — PROPOFOL INFUSION 10 MG/ML OPTIME
INTRAVENOUS | Status: DC | PRN
  Administered 2012-08-12: 100 ug/kg/min via INTRAVENOUS

## 2012-08-12 MED ORDER — PROMETHAZINE HCL 25 MG/ML IJ SOLN: 6.2500 mg | INTRAMUSCULAR | Status: DC | PRN

## 2012-08-12 MED ORDER — KETAMINE HCL 10 MG/ML IJ SOLN
INTRAMUSCULAR | Status: DC | PRN
  Administered 2012-08-12: 20 mg via INTRAVENOUS

## 2012-08-12 MED ORDER — ONDANSETRON HCL 4 MG/2ML IJ SOLN
INTRAMUSCULAR | Status: DC | PRN
  Administered 2012-08-12: 4 mg via INTRAVENOUS

## 2012-08-12 MED ORDER — LACTATED RINGERS IV SOLN
INTRAVENOUS | Status: DC | PRN
  Administered 2012-08-12: 14:00:00 via INTRAVENOUS

## 2012-08-12 MED ORDER — POTASSIUM CHLORIDE IN NACL 20-0.9 MEQ/L-% IV SOLN
INTRAVENOUS | Status: DC
  Administered 2012-08-12: 20:00:00 via INTRAVENOUS
  Filled 2012-08-12 (×4): qty 1000

## 2012-08-12 MED ORDER — SODIUM CHLORIDE 0.9 % IV SOLN: INTRAVENOUS | Status: DC

## 2012-08-12 MED ORDER — LIDOCAINE HCL (CARDIAC) 20 MG/ML IV SOLN
INTRAVENOUS | Status: DC | PRN
  Administered 2012-08-12: 100 mg via INTRAVENOUS

## 2012-08-12 MED ORDER — LACTATED RINGERS IV SOLN
INTRAVENOUS | Status: DC
  Administered 2012-08-12: 13:00:00 via INTRAVENOUS

## 2012-08-12 MED ORDER — SODIUM CHLORIDE 0.9 % IV SOLN
INTRAVENOUS | Status: DC
Start: 2012-08-12 — End: 2012-08-12

## 2012-08-12 MED ORDER — MIDAZOLAM HCL 5 MG/5ML IJ SOLN
INTRAMUSCULAR | Status: DC | PRN
  Administered 2012-08-12: 0.5 mg via INTRAVENOUS

## 2012-08-12 SURGICAL SUPPLY — 24 items

## 2012-08-12 NOTE — Op Note (Addendum)
[facility] [address] [place], [address]   ENDOSCOPY PROCEDURE REPORT  PATIENT: Alan Benson, Alan Benson  MR#: [id number] BIRTHDATE: [birth year] , 36  yrs. old GENDER: Male ENDOSCOPIST: Iva Boop, MD, [facility] PROCEDURE DATE:  08/12/2012 PROCEDURE:  EGD w/ biopsy ASA CLASS:     Class III INDICATIONS:  Nausea.   Vomiting.   Unexplained diarrhea. abdominal pain. MEDICATIONS: See Anesthesia Report. TOPICAL ANESTHETIC: none  DESCRIPTION OF PROCEDURE: After the risks benefits and alternatives of the procedure were thoroughly explained, informed consent was obtained.  The    endoscope was introduced through the mouth and advanced to the second portion of the duodenum. Without limitations.  The instrument was slowly withdrawn as the mucosa was fully examined.      The upper, middle and distal third of the esophagus were carefully inspected and no abnormalities were noted.  The z-line was well seen at the GEJ.  The endoscope was pushed into the fundus which was normal including a retroflexed view.  The antrum, gastric body, first and second part of the duodenum were unremarkable.  Multiple random biopsies were performed in the bulb and D2 and sent to pathology.  Retroflexed views revealed no abnormalities.     The scope was then withdrawn from the patient and the procedure completed.  COMPLICATIONS: There were no complications. ENDOSCOPIC IMPRESSION: Normal EGD; multiple random biopsies of duodenum given unexplained diarrhea  RECOMMENDATIONS: 1.  Proceed with a Colonoscopy. 2.  Await biopsy results    eSigned:  Iva Boop, MD, [facility] 08/12/2012 2:38 PM   CC: the Patient

## 2012-08-12 NOTE — Transfer of Care (Signed)
Immediate Anesthesia Transfer of Care Note  Patient: Alan Benson  Procedure(s) Performed: Procedure(s) (LRB): ESOPHAGOGASTRODUODENOSCOPY (EGD) WITH PROPOFOL (N/A) COLONOSCOPY WITH PROPOFOL (N/A)  Patient Location: PACU  Anesthesia Type: MAC  Level of Consciousness: sedated, patient cooperative and responds to stimulaton  Airway & Oxygen Therapy: Patient Spontanous Breathing and Patient connected to face mask oxgen  Post-op Assessment: Report given to PACU RN and Post -op Vital signs reviewed and stable  Post vital signs: Reviewed and stable  Complications: No apparent anesthesia complications

## 2012-08-12 NOTE — Op Note (Signed)
[facility] [address] [place], [address]   COLONOSCOPY PROCEDURE REPORT  PATIENT: Alan Benson, Alan Benson  MR#: [id number] BIRTHDATE: 10-14-1975 , 36  yrs. old GENDER: Male ENDOSCOPIST: Iva Boop, MD, [facility] PROCEDURE DATE:  08/12/2012 PROCEDURE:   Colonoscopy with biopsy ASA CLASS:   Class III INDICATIONS:unexplained diarrhea. MEDICATIONS: See Anesthesia Report.  DESCRIPTION OF PROCEDURE:   After the risks benefits and alternatives of the procedure were thoroughly explained, informed consent was obtained.  A digital rectal exam revealed no abnormalities of the rectum, A digital rectal exam revealed the prostate was not enlarged, and A digital rectal exam revealed no prostatic nodules.   The     endoscope was introduced through the anus and advanced to the terminal ileum which was intubated for a short distance. No adverse events experienced.   The quality of the prep was Moviprep fair  The instrument was then slowly withdrawn as the colon was fully examined.      COLON FINDINGS: The colonic mucosa appeared normal throughout the entire examined colon.  Multiple random biopsies of the area were performed. the terminal ileum was seen through open IC valve in retroflexion and was normal.  Retroflexed views in rectum revealed no abnormalities. The time to cecum=12 minutes 0 seconds. Withdrawal time=12 minutes 0 seconds.  The scope was withdrawn and the procedure completed. COMPLICATIONS: There were no complications.  ENDOSCOPIC IMPRESSION: The colonic mucosa appeared normal throughout the entire examined colon; multiple random biopsies of the area were performed - terminal ileum also ok. Formed stool in left colon raises ? of some impaction which could have been cause of sxs of diarrhea. Will see how he does after this purge.  RECOMMENDATIONS: 1.  Await biopsy results 2.   Office visit Dr.  Leone Payor 1 month   eSigned:  Iva Boop, MD, [facility]  08/12/2012 2:50 PM

## 2012-08-12 NOTE — Preoperative (Signed)
Beta Blockers   Reason not to administer Beta Blockers:Not Applicable 

## 2012-08-12 NOTE — Progress Notes (Signed)
INITIAL NUTRITION ASSESSMENT  DOCUMENTATION CODES Per approved criteria  -Underweight   INTERVENTION: - Diet advancement per MD - Will add Mighty Shakes when diet advanced - Will continue to monitor   NUTRITION DIAGNOSIS: Altered GI function related to nausea, vomiting, abdominal pain, diarrhea as evidenced by H&P.   Goal: 1. Resolution of nausea, vomiting, abdominal pain, and diarrhea 2. Advance diet as tolerated to low fiber, bland diet  Monitor:  Weights, labs, diet advancement, nausea/vomiting, diarrhea  Reason for Assessment: Nutrition risk   37 y.o. male  Admitting Dx: Diarrhea, abdominal pain, nausea, and vomiting   ASSESSMENT: Pt with cerebral palsy with severe flexion contractures of all joints. Pt from [facility]. Pt admitted with intermittent abdominal pain, nausea every morning upon waking up, and at times vomiting clear emesis. Pt had diarrhea 3 times/day PTA. Met with pt who denied any vomiting today. Per conversation with [facility] staff, pt was on a regular diet and typically had a good appetite. Pt was getting Mighty Shakes TID. They report pt with stable weight. Pt getting EGD and colonoscopy today.   Height: Ht Readings from Last 1 Encounters:  06/12/12 5\' 5"  (1.651 m)    Weight: 05/14/12 95 lb (43 kg)  Ideal Body Weight: 136 lb  % Ideal Body Weight: 70  Wt Readings from Last 10 Encounters:  03/06/12 87 lb (39.463 kg)  11/19/11 84 lb (38.102 kg)  10/18/09 88 lb (39.917 kg)  05/10/09 91 lb (41.277 kg)    Usual Body Weight: 95.2 lb on 07/19/12 per [facility]  % Usual Body Weight: 100  BMI:  14.5 kg/(m^2)  Estimated Nutritional Needs: Kcal: 1550-1800 Protein: 80-90g Fluid: 1.5-1.8L/day  Skin: Left hand abrasion  Diet Order: NPO  EDUCATION NEEDS: -No education needs identified at this time   Intake/Output Summary (Last 24 hours) at 08/12/12 1201 Last data filed at 08/11/12 2036  Gross per 24 hour  Intake      0 ml  Output      0 ml   Net      0 ml    Last BM: 4/30, diarrhea  Labs:   Recent Labs Lab 08/11/12 1644  NA 139  K 4.6  CL 102  CO2 25  BUN 11  CREATININE 0.95  CALCIUM 9.4  GLUCOSE 103*    CBG (last 3)  No results found for this basename: GLUCAP,  in the last 72 hours  Scheduled Meds: . baclofen  20 mg Oral QID  . chlorhexidine  15 mL Mouth/Throat q morning - 10a  . diazepam  10 mg Oral QHS  . folic acid  1 mg Oral Daily  . mesalamine  1.5 g Oral Daily  . multivitamins with iron  1 tablet Oral Daily  . pantoprazole  40 mg Oral Daily  . sodium chloride  3 mL Intravenous Q12H  . tiZANidine  8 mg Oral QID    Continuous Infusions: . 0.9 % NaCl with KCl 20 mEq / L 75 mL/hr at 08/12/12 0300    Past Medical History  Diagnosis Date  . Lymphocytic colitis 12/21/2004  . Failure to thrive in childhood   . Varicose vein   . Cerebral palsy   . Severe flexion contractures of all joints   . Diarrhea   . Mixed spastic athetoid cerebral palsy   . CNS disorder   . Muscle spasms of head and/or neck     Past Surgical History  Procedure Laterality Date  . Colonoscopy  2006  Alan Benson Baron MS, RD, LDN [phone] Pager [phone] After Hours Pager  

## 2012-08-12 NOTE — Progress Notes (Signed)
Attempted to in and out cath, but not neccessary, pt was able to void spontaneously.

## 2012-08-12 NOTE — Anesthesia Preprocedure Evaluation (Addendum)
Anesthesia Evaluation  Patient identified by MRN, date of birth, ID band Patient awake    Reviewed: Allergy & Precautions, H&P , NPO status , Patient's Chart, lab work & pertinent test results  Airway Mallampati: II TM Distance: >3 FB Neck ROM: Limited  Mouth opening: Limited Mouth Opening  Dental no notable dental hx.    Pulmonary neg pulmonary ROS,  breath sounds clear to auscultation  Pulmonary exam normal       Cardiovascular negative cardio ROS  Rhythm:Regular Rate:Normal     Neuro/Psych Cerebral palsy, quadriplegia. Mixed spastic athetoid cerebral palsy. Muscle spasms of head and neck.  Neuromuscular disease negative psych ROS   GI/Hepatic negative GI ROS, Neg liver ROS,   Endo/Other  negative endocrine ROS  Renal/GU negative Renal ROS  negative genitourinary   Musculoskeletal negative musculoskeletal ROS (+)   Abdominal   Peds negative pediatric ROS (+)  Hematology negative hematology ROS (+)   Anesthesia Other Findings   Reproductive/Obstetrics negative OB ROS                          Anesthesia Physical Anesthesia Plan  ASA: III  Anesthesia Plan: MAC   Post-op Pain Management:    Induction: Intravenous  Airway Management Planned: Nasal Cannula  Additional Equipment:   Intra-op Plan:   Post-operative Plan:   Informed Consent: I have reviewed the patients History and Physical, chart, labs and discussed the procedure including the risks, benefits and alternatives for the proposed anesthesia with the patient or authorized representative who has indicated his/her understanding and acceptance.   Dental advisory given  Plan Discussed with: CRNA  Anesthesia Plan Comments:        Anesthesia Quick Evaluation

## 2012-08-12 NOTE — Progress Notes (Signed)
Patient states unable to void during night. Bladder scan indicated >200 cc in bladder. MD notified will in and out cath PRN per order.

## 2012-08-12 NOTE — Anesthesia Postprocedure Evaluation (Signed)
  Anesthesia Post-op Note  Patient: Alan Benson  Procedure(s) Performed: Procedure(s) (LRB): ESOPHAGOGASTRODUODENOSCOPY (EGD) WITH PROPOFOL (N/A) COLONOSCOPY WITH PROPOFOL (N/A)  Patient Location: PACU  Anesthesia Type: MAC  Level of Consciousness: awake and alert   Airway and Oxygen Therapy: Patient Spontanous Breathing  Post-op Pain: mild  Post-op Assessment: Post-op Vital signs reviewed, Patient's Cardiovascular Status Stable, Respiratory Function Stable, Patent Airway and No signs of Nausea or vomiting  Last Vitals:  Filed Vitals:   08/12/12 1445  BP:   Pulse:   Temp: 36.8 C  Resp:     Post-op Vital Signs: stable   Complications: No apparent anesthesia complications

## 2012-08-13 ENCOUNTER — Encounter (HOSPITAL_COMMUNITY): Payer: Self-pay | Admitting: Internal Medicine

## 2012-08-13 MED ORDER — DIAZEPAM 5 MG PO TABS: 5.0000 mg | ORAL_TABLET | Freq: Two times a day (BID) | ORAL | Status: DC

## 2012-08-13 NOTE — Progress Notes (Signed)
Clinical Social Work Department BRIEF PSYCHOSOCIAL ASSESSMENT 08/13/2012  Patient:  Alan Benson, Alan Benson     Account Number:  [id number]     Admit date:  08/11/2012  Clinical Social Worker:  Jacelyn Grip  Date/Time:  08/13/2012 11:21 AM  Referred by:  Care Management  Date Referred:  08/13/2012 Referred for  Other - See comment   Other Referral:   Admitted from [facility]   Interview type:  Patient Other interview type:    PSYCHOSOCIAL DATA Living Status:  FACILITY Admitted from facility:  [facility] Level of care:  Group Home Primary support name:  Arna Medici Oloughlin/mother Primary support relationship to patient:  PARENT Degree of support available:    CURRENT CONCERNS Current Concerns  Post-Acute Placement   Other Concerns:    SOCIAL WORK ASSESSMENT / PLAN CSW received notification from [facility] that pt admitted from [facility] and likely will be ready for discharge today.    CSW met with pt at bedside who confirmed that he is a resident at [facility] and plan is to return. CSW notified pt that it is likely he will be able to return to [facility] today and pt expressed excitement about this. Pt asked that CSW contact pt mother to notify of d/c. CSW obtained pt mother's contact phone number from [facility] and left voice message for pt mother.    CSW contacted [employer] who confirmed that pt could return, but facility would need discharge information faxed prior to discharge. Per facility, [facility] can provide transportation for pt.    CSW to continue to follow and facilitate pt discharge needs back to [facility] when pt medically ready for discharge.   Assessment/plan status:  Psychosocial Support/Ongoing Assessment of Needs Other assessment/ plan:   discharge planning   Information/referral to community resources:   Referral back to [facility]    PATIENT'S/FAMILY'S RESPONSE TO PLAN OF CARE: Pt alert and oriented x 4. Pt appeared relieved that he will likely return to [facility] today and is eager to return.    Jacklynn Lewis, MSW, LCSWA  Clinical Social Work [phone]

## 2012-08-13 NOTE — Progress Notes (Signed)
Patient discharged home to [facility]. Copies of all discharge medications and instructions sent with patient.  Patient transported by facility in wheelchair.

## 2012-08-13 NOTE — Progress Notes (Signed)
[facility] Progress Note  SUBJECTIVE: feels okay today, does endorse " a little abdominal pain". No diarrhea this am. Eating solids per nurse  OBJECTIVE:  Vital signs in last 24 hours: Temp:  [97.3 F (36.3 C)-98.9 F (37.2 C)] 98 F (36.7 C) (05/01 0500) Pulse Rate:  [74-102] 88 (05/01 0530) Resp:  [16-18] 16 (05/01 0500) BP: (145-185)/(83-110) 155/83 mmHg (05/01 0530) SpO2:  [96 %-99 %] 98 % (05/01 0500) Weight:  [95 lb (43.092 kg)] 95 lb (43.092 kg) (04/30 1251) Last BM Date: 08/12/12 General:    Pleasant white male in NAD lying on stomach Lungs: Respirations even and unlabored, lungs CTA bilaterally Abdomen:  Soft, nontender and nondistended. Normal bowel sounds. Extremities:  Without edema. Neurologic:  Alert and oriented    Lab Results:  Recent Labs  08/11/12 1644  WBC 9.1  HGB 14.9  HCT 43.1  PLT 291   BMET  Recent Labs  08/11/12 1644  NA 139  K 4.6  CL 102  CO2 25  GLUCOSE 103*  BUN 11  CREATININE 0.95  CALCIUM 9.4   LFT  Recent Labs  08/11/12 1644  PROT 7.2  ALBUMIN 3.8  AST 30  ALT 35  ALKPHOS 75  BILITOT 1.0    ASSESSMENT / PLAN:  1. Chronic diarrhea, history of lymphocytic colitis. On mesalamine and Loperamide at home. Recently responded to course of prednisone but then developed recurrent diarrhea.  Colonoscopy yesterday was normal. Biopsies pending.   2. Nausea and vomiting. EGD yesterday was normal. Duodenal biopsies (for diarrhea) pending. Patient tolerating diet today, feels okay and ready for hospital discharge. Continue PPI at home. Follow up with Dr. Leone Payor in a month.     LOS: 2 days   Willette Cluster  08/13/2012, 12:20 PM

## 2012-08-13 NOTE — Progress Notes (Signed)
[facility] Attending  I have also seen and assessed the patient and agree with the above note.

## 2012-08-13 NOTE — Progress Notes (Signed)
Pt discharged to [facility].   CSW facilitated pt discharge back to [facility] including contacting facility, faxing pt discharge information to facility, confirming that facility received and approved discharge information, discussing with RN, and [employer] provided wheelchair transportation for pt back to the facility. Discharge packet provided to [facility] at discharge.  CSW received return phone call from pt parents as pt had requested CSW contact pt mother earlier. CSW notified pt parents that pt was returning to [facility]. Pt parents had medical questions and CSW encouraged pt parents to follow up with [facility] upon pt return in order to have these questions clarified.   No further social work needs identified at this time.   CSW signing off.    Jacklynn Lewis, MSW, LCSWA  Clinical Social Work [phone]

## 2012-08-13 NOTE — Discharge Summary (Signed)
[facility] Discharge Summary  Name: Alan Benson MRN: [id number] DOB: 25-Mar-1976 36 y.o. PCP:  No primary provider on file.  Date of Admission: 08/11/2012 12:46 PM Date of Discharge: 08/13/2012 Primary Gastroenterologist: Stan Head, MD Discharging Physician: Stan Head, MD  Discharge Diagnosis: 1. Chronic diarrhea 2. History of lymphocytic colitis 3. Nausea and vomiting  Consultations:none   GI Procedures:  1.  colonoscopy with biospies 2.  EGD with duodenal biopsies  History/Physical Exam:  See Admission H&P  Admission HPI: Alan Benson is a 37 y.o. male with CP who is a patient of Dr. Marvell Fuller. He has lymphocytic colitis and is on apriso 4 caps daily along with Imodium prn. Had been on prednisone in the past as well. Was doing well at his last visit earlier this month but says that the diarrhea has returned again. 3 times per day. Has intermittent abdominal pains as well. Nausea each morning upon waking up. Vomits clear stuff at times and vomits after taking his pills. His PCP suggested that he should have a colonoscopy. It was decided that we should admit him to [facility] for preparation for EGD and colonoscopy for evaluation of his symptoms   Hospital Course by problem list:  1. Chronic diarrhea with history of lymphocytic colitis. Patient has a history of lymphocytic colitis for which he takes Apriso and Loperamide. He presented to our office a few days ago with recurrent diarrhea after good results from a course of prednisone several weeks ago.  Due to ongoing diarrhea it was decided that patient would undergo repeat colonoscopy with biopsies for further evaluation. Patient was admitted 08/12/11 in preparation for colonoscopy the following morning. Admitting CBC and CMET were normal. On 4/30 patient had an uneventful colonoscopy by Dr. Leone Payor. Exam was normal, random biopsies pending at time of discharge. Patient remained in the hospital overnight following the  procedure. His second night stay was uneventful. The following morning he was stable for discharge. Patient would follow up with Dr. Leone Payor on 09/09/12.   2. Nausea and vomiting. Patient underwent EGD at time of his colonoscopy. The EGD was normal. Etiology of nausea and vomiting not yet determined but patient was tolerating solid food on day of discharge.   Discharge Vitals:  BP 155/83  Pulse 88  Temp(Src) 98 F (36.7 C) (Oral)  Resp 16  Ht 5\' 7"  (1.702 m)  Wt 95 lb (43.092 kg)  BMI 14.88 kg/m2  SpO2 98%  Discharge Labs: No results found for this or any previous visit (from the past 24 hour(s)).  Disposition and follow-up:   Alan Benson was discharged to [facility] from [facility] in stable condition.  .  Follow-up Appointments: Discharge Orders   Future Appointments Provider Department Dept Phone   09/09/2012 2:00 PM Iva Boop, MD [facility] [phone]   Future Orders Complete By Expires     Resume previous diet  As directed        Discharge Medications:   Medication List    STOP taking these medications       predniSONE 10 MG tablet  Commonly known as:  DELTASONE      TAKE these medications       baclofen 20 MG tablet  Commonly known as:  LIORESAL  Take 20 mg by mouth 4 (four) times daily.     chlorhexidine 0.12 % solution  Commonly known as:  PERIDEX  Use as directed 15 mLs in the mouth or throat every morning.  diazepam 5 MG tablet  Commonly known as:  VALIUM  Take 5-10 mg by mouth 2 (two) times daily. 5mg  in the am and 10mg  in the pm     Folic Acid 0.8 MG Caps  Take 0.8 mg by mouth daily.     loperamide 2 MG capsule  Commonly known as:  IMODIUM  Take 2 mg by mouth 4 (four) times daily as needed for diarrhea or loose stools.     mesalamine 0.375 G 24 hr capsule  Commonly known as:  APRISO  Take 4 capsules (1.5mg ) by mouth in the morning.  Do Not Crush.     multivitamins with iron Tabs  Take 1 tablet  by mouth daily.     ondansetron 8 MG tablet  Commonly known as:  ZOFRAN  Take 8 mg by mouth every 8 (eight) hours as needed for nausea.     pantoprazole 40 MG tablet  Commonly known as:  PROTONIX  Take 1 tablet (40 mg total) by mouth daily.     tiZANidine 4 MG capsule  Commonly known as:  ZANAFLEX  Take 8 mg by mouth 4 (four) times daily.        Signed: Willette Cluster 08/13/2012, 12:49 PM    [facility] Attending  I have also seen and assessed the patient and agree with the above note.  Iva Boop, MD, [facility] [phone] (pager) 08/13/2012 4:00 PM

## 2012-08-16 NOTE — Progress Notes (Signed)
Quick Note:  Lymphocytic colitis persists - please see if budesonide (Entocort EC) Tx possible on Medicaid ______

## 2012-08-17 ENCOUNTER — Other Ambulatory Visit: Payer: Self-pay

## 2012-08-17 ENCOUNTER — Telehealth: Payer: Self-pay

## 2012-08-17 MED ORDER — BUDESONIDE 3 MG PO CP24: 9.0000 mg | ORAL_CAPSULE | ORAL | Status: DC

## 2012-08-17 NOTE — Telephone Encounter (Signed)
Spoke with [employer] to check on [place].  They report he has been sick over the weekend, nausea.  N & V this AM.  The PCP is coming in this AM and she is going to check him out and they will call us back with further information.

## 2012-09-09 ENCOUNTER — Ambulatory Visit: Payer: Medicaid Other | Admitting: Internal Medicine

## 2012-10-06 ENCOUNTER — Ambulatory Visit (INDEPENDENT_AMBULATORY_CARE_PROVIDER_SITE_OTHER): Payer: Medicaid Other | Admitting: Internal Medicine

## 2012-10-06 ENCOUNTER — Encounter: Payer: Self-pay | Admitting: Internal Medicine

## 2012-10-06 VITALS — BP 124/78 | HR 70

## 2012-10-06 DIAGNOSIS — K5289 Other specified noninfective gastroenteritis and colitis: Secondary | ICD-10-CM

## 2012-10-06 DIAGNOSIS — K52832 Lymphocytic colitis: Secondary | ICD-10-CM

## 2012-10-06 DIAGNOSIS — R634 Abnormal weight loss: Secondary | ICD-10-CM

## 2012-10-06 MED ORDER — LOPERAMIDE HCL 2 MG PO CAPS: 2.0000 mg | ORAL_CAPSULE | Freq: Three times a day (TID) | ORAL | Status: DC

## 2012-10-06 MED ORDER — LOPERAMIDE HCL 2 MG PO CAPS: ORAL_CAPSULE | ORAL | Status: DC

## 2012-10-06 NOTE — Patient Instructions (Addendum)
We are changing his Imodium to four times a day , before meals and at bedtime.  We are giving you a printed rx for this today.  Dr. Leone Payor will contact Ms. Barr N.P. To consult about Jamere.  We want to see you back in a month.  We made you an appointment for July 29th at 3:15pm.   I appreciate the opportunity to care for you.

## 2012-10-06 NOTE — Progress Notes (Addendum)
Subjective:    Patient ID: Alan Benson, male    DOB: Aug 18, 1975, 37 y.o.   MRN: [id number]  HPI Alan Benson returns for followup. He has lymphocytic colitis and struggled with diarrhea. When last seen in April he seemed to be doing reasonably well, he was off prednisone and on oral mesalamine. A month or so later he called worthy Alan Benson did and said he was having more diarrhea problems. Alan Benson. It has not helped. Apparently he is down 8 pounds in weight, and he is continuing to have multiple diarrhea stools he is unable to work. For the first time today I met Alan mom, who came to be with him for this appointment. She tells me he's had diarrhea problems since the age of 6. Mostly has been intermittent though was bad when he was diagnosed with lymphocytic colitis we was hospitalized in the late 2000 and again more lately has been bad and really never quite this pattern persisted.  A nurse practitioner caring for him is tried several things including aloe vera gel, fiber supplementation and more recently put him on a gluten-free diet. He is also on magnesium oxide. He is eating as best he can on this diet.  He is not having nausea or vomiting at this time.  He is on Imodium as needed but not regularly. He takes Librax with meals.  He had doxycycline prescription for a skin infection, think he had bone loss, in late May. Things might have worsened after that but they Benson before then. Alan Benson said that antibiotics frequently disrupt and cause diarrhea. Allergies  Allergen Reactions  . Penicillins     REACTION: Hives   Outpatient Prescriptions Prior to Visit  Medication Sig Dispense Refill  . baclofen (LIORESAL) 20 MG tablet Take 20 mg by mouth 4 (four) times daily.      . Alan (ENTOCORT EC) 3 MG 24 hr capsule Take 3 capsules (9 mg total) by mouth every morning.  90 capsule  0  . chlorhexidine (PERIDEX) 0.12 % solution Use as directed 15 mLs in the mouth or  throat every morning.      . diazepam (VALIUM) 5 MG tablet Take 1-2 tablets (5-10 mg total) by mouth 2 (two) times daily. 5mg  in the am and 10mg  in the pm  45 tablet  3  . Folic Acid 0.8 MG CAPS Take 0.8 mg by mouth daily.      . mesalamine (APRISO) 0.375 G 24 hr capsule Take 4 capsules (1.5mg ) by mouth in the morning.  Do Not Crush.  120 capsule  2  . Multiple Vitamins-Iron (MULTIVITAMINS WITH IRON) TABS Take 1 tablet by mouth daily.      . ondansetron (ZOFRAN) 8 MG tablet Take 8 mg by mouth every 8 (eight) hours as needed for nausea.       . pantoprazole (PROTONIX) 40 MG tablet Take 1 tablet (40 mg total) by mouth daily.  30 tablet  11  . tiZANidine (ZANAFLEX) 4 MG capsule Take 8 mg by mouth 4 (four) times daily.      Marland Kitchen loperamide (IMODIUM) 2 MG capsule Take 2 mg by mouth 4 (four) times daily as needed for diarrhea or loose stools.       No facility-administered medications prior to visit.   Past Medical History  Diagnosis Date  . Lymphocytic colitis 12/21/2004  . Failure to thrive in childhood   . Varicose vein   . Cerebral palsy   .  Severe flexion contractures of all joints   . Diarrhea   . Mixed spastic athetoid cerebral palsy   . CNS disorder   . Muscle spasms of head and/or neck    Past Surgical History  Procedure Laterality Date  . Colonoscopy  2006  . Esophagogastroduodenoscopy (egd) with propofol N/A 08/12/2012    Procedure: ESOPHAGOGASTRODUODENOSCOPY (EGD) WITH PROPOFOL;  Surgeon: Iva Boop, MD;  Location: [facility];  Service: Endoscopy;  Laterality: N/A;  . Colonoscopy with propofol N/A 08/12/2012    Procedure: COLONOSCOPY WITH PROPOFOL;  Surgeon: Iva Boop, MD;  Location: [facility];  Service: Endoscopy;  Laterality: N/A;     Review of Systems As above, he is frustrated that he cannot work because of the frequent diarrhea.    Objective:   Physical Exam Middle-aged man with spastic cerebral palsy in wheelchair. Acne changes on the face. He is thin     Assessment & Plan:   1. Lymphocytic colitis   2. Loss of weight     This is a complicated situation I presume is mostly if not all related to lymphocytic colitis but I cannot be sure. I evaluated him for celiac disease with tissue transglutaminase antibody and duodenal biopsies that were normal. It's possible he could have gluten sensitivity, and a gluten-free diet has been tried for refractory lymphocytic colitis per literature/ Up To Date. My current thoughts are:  1. Continue to gluten-free diet for now 2. Continue Alan for now it may still help  3. Schedule Ioperamide 2 mg 4 times a day 4. I will communicate with Alan Lor, NP about what has transpired so far - done 10/07/2012 5. Consider discontinuing the magnesium oxide 6. Next options would be increasing immunosuppression e.g. or azathioprine.

## 2012-10-08 ENCOUNTER — Encounter: Payer: Self-pay | Admitting: Internal Medicine

## 2012-10-08 NOTE — Progress Notes (Signed)
Patient ID: Alan Benson, male   DOB: October 30, 1975, 37 y.o.   MRN: [id number] When you get a chance please fax her the office notes, pathology reports and procedure reports on Alan Benson keep her fax # - she is NP taking care of him ----- Message ----- From: Rossie Muskrat, RN Sent: 10/07/2012 9:01 AM To: Iva Boop, MD Fax number for Alan Benson [phone]. I will see if she can be added to the system   This was mailed to Alan Benson N.P at :[address] [place] ,  [place] [address]   Garth Bigness

## 2012-10-10 ENCOUNTER — Telehealth: Payer: Self-pay | Admitting: Physician Assistant

## 2012-10-17 ENCOUNTER — Emergency Department (HOSPITAL_COMMUNITY)
Admission: EM | Admit: 2012-10-17 | Discharge: 2012-10-17 | Disposition: A | Discharge status: Skilled Nursing Facility | Payer: Medicaid Other | Attending: Emergency Medicine | Admitting: Emergency Medicine

## 2012-10-17 ENCOUNTER — Encounter (HOSPITAL_COMMUNITY): Payer: Self-pay | Admitting: Physical Medicine and Rehabilitation

## 2012-10-17 DIAGNOSIS — Z880 Allergy status to penicillin: Secondary | ICD-10-CM | POA: Insufficient documentation

## 2012-10-17 DIAGNOSIS — R110 Nausea: Secondary | ICD-10-CM | POA: Insufficient documentation

## 2012-10-17 DIAGNOSIS — Z8719 Personal history of other diseases of the digestive system: Secondary | ICD-10-CM | POA: Insufficient documentation

## 2012-10-17 DIAGNOSIS — R5381 Other malaise: Secondary | ICD-10-CM | POA: Insufficient documentation

## 2012-10-17 DIAGNOSIS — Z8669 Personal history of other diseases of the nervous system and sense organs: Secondary | ICD-10-CM | POA: Insufficient documentation

## 2012-10-17 DIAGNOSIS — R197 Diarrhea, unspecified: Secondary | ICD-10-CM

## 2012-10-17 DIAGNOSIS — G8929 Other chronic pain: Secondary | ICD-10-CM | POA: Insufficient documentation

## 2012-10-17 DIAGNOSIS — R079 Chest pain, unspecified: Secondary | ICD-10-CM | POA: Insufficient documentation

## 2012-10-17 DIAGNOSIS — Z79899 Other long term (current) drug therapy: Secondary | ICD-10-CM | POA: Insufficient documentation

## 2012-10-17 DIAGNOSIS — Z8739 Personal history of other diseases of the musculoskeletal system and connective tissue: Secondary | ICD-10-CM | POA: Insufficient documentation

## 2012-10-17 LAB — COMPREHENSIVE METABOLIC PANEL
ALT: 17 U/L (ref 0–53)
Alkaline Phosphatase: 57 U/L (ref 39–117)
BUN: 15 mg/dL (ref 6–23)
CO2: 25 mEq/L (ref 19–32)
GFR calc Af Amer: >90 mL/min (ref >90)
GFR calc non Af Amer: >90 mL/min (ref >90)
Glucose, Bld: 101 mg/dL — ABNORMAL HIGH (ref 70–99)
Potassium: 3.7 mEq/L (ref 3.5–5.1)
Sodium: 142 mEq/L (ref 135–145)

## 2012-10-17 LAB — CBC WITH DIFFERENTIAL/PLATELET
Basophils Absolute: 0 10*3/uL (ref 0.0–0.1)
Basophils Relative: 1 % (ref 0–1)
Eosinophils Absolute: 0.1 10*3/uL (ref 0.0–0.7)
Eosinophils Relative: 1 % (ref 0–5)
HCT: 42.4 % (ref 39.0–52.0)
Lymphocytes Relative: 26 % (ref 12–46)
MCH: 30 pg (ref 26.0–34.0)
MCHC: 35.8 g/dL (ref 30.0–36.0)
MCV: 83.6 fL (ref 78.0–100.0)
Monocytes Absolute: 0.7 10*3/uL (ref 0.1–1.0)
Platelets: 264 10*3/uL (ref 150–400)
RDW: 13.1 % (ref 11.5–15.5)

## 2012-10-17 MED ORDER — SODIUM CHLORIDE 0.9 % IV BOLUS (SEPSIS)
1000.0000 mL | Freq: Once | INTRAVENOUS | Status: AC
  Administered 2012-10-17: 1000 mL via INTRAVENOUS

## 2012-10-17 NOTE — ED Notes (Signed)
Spoke with Elease Hashimoto at [facility], pt to be transported back by [employer]. Pt has no questions at the time. Encouraged to follow up with PCP on Monday.

## 2012-10-17 NOTE — ED Notes (Signed)
Pt resting quietly at the time. Friend at the bedside. Vital signs stable. Pt and friend aware that we are waiting on [facility] to transport. No signs of distress noted.

## 2012-10-17 NOTE — ED Notes (Signed)
Pt presents to department via [facility] from [facility] for diffuse abdominal pain and diarrhea. Onset this morning. History of cerebral palsy and chronic colitis. Pt is alert and oriented x4 upon arrival.

## 2012-10-17 NOTE — ED Provider Notes (Signed)
I have supervised the resident on the management of this patient and agree with the note above. I personally interviewed and examined the patient and my addendum is below.   Alan Benson is a 37 y.o. male hx of cerebral palsy, lymphocytic colitis here with diarrhea. Diarrhea for several months. Has been seeing GI for this several times. He is on multiple medications that he said hasn't helped. Abdomen soft and nontender. I spoke with Dr. Rhea Benson who recommend lomotil and f/u outpatient. They may start azathioprine outpatient but will see him next week. Labs at baseline, hydrated in the ED and didn't appear very dehydrated.    Alan Canal, MD 10/17/12 1535

## 2012-10-17 NOTE — ED Provider Notes (Signed)
History    CSN: [id number] Arrival date & time 10/17/12  8295  First MD Initiated Contact with Patient 10/17/12 0805     Chief Complaint  Patient presents with  . Abdominal Pain   (Consider location/radiation/quality/duration/timing/severity/associated sxs/prior Treatment) HPI Comments: Patient is a 37 yowm with a PMH of CP and chronic lymphocytic colitis.  He is a known patient of Dr. Leone Payor.  He has been experiencing abdominal pain and diarrhea for "a long time."  He denies any fever, chills, but does report some nausea this am.  He does not complain of chest pain, shortness of breath.  He denies any urinary symptoms.  He reports recent significant weight loss.  Patient is a 37 y.o. male presenting with abdominal pain.  Abdominal Pain Associated symptoms include abdominal pain, chest pain, fatigue and nausea. Pertinent negatives include no chills, coughing, fever or vomiting.   Past Medical History  Diagnosis Date  . Lymphocytic colitis 12/21/2004  . Failure to thrive in childhood   . Varicose vein   . Cerebral palsy   . Severe flexion contractures of all joints   . Diarrhea   . Mixed spastic athetoid cerebral palsy   . CNS disorder   . Muscle spasms of head and/or neck    Past Surgical History  Procedure Laterality Date  . Colonoscopy  2006  . Esophagogastroduodenoscopy (egd) with propofol N/A 08/12/2012    Procedure: ESOPHAGOGASTRODUODENOSCOPY (EGD) WITH PROPOFOL;  Surgeon: Iva Boop, MD;  Location: [facility];  Service: Endoscopy;  Laterality: N/A;  . Colonoscopy with propofol N/A 08/12/2012    Procedure: COLONOSCOPY WITH PROPOFOL;  Surgeon: Iva Boop, MD;  Location: [facility];  Service: Endoscopy;  Laterality: N/A;   History reviewed. No pertinent family history. History  Substance Use Topics  . Smoking status: Never Smoker   . Smokeless tobacco: Never Used  . Alcohol Use: No    Review of Systems  Constitutional: Positive for fatigue and unexpected  weight change. Negative for fever and chills.  Respiratory: Negative for cough and shortness of breath.   Cardiovascular: Positive for chest pain.  Gastrointestinal: Positive for nausea, abdominal pain and diarrhea. Negative for vomiting, constipation and blood in stool.  Genitourinary: Negative for dysuria, frequency and difficulty urinating.  All other systems reviewed and are negative.    Allergies  Penicillins  Home Medications   Current Outpatient Rx  Name  Route  Sig  Dispense  Refill  . baclofen (LIORESAL) 20 MG tablet   Oral   Take 20 mg by mouth 4 (four) times daily.         . bifidobacterium infantis (ALIGN) capsule   Oral   Take 1 capsule by mouth daily.         . budesonide (ENTOCORT EC) 3 MG 24 hr capsule   Oral   Take 3 capsules (9 mg total) by mouth every morning.   90 capsule   0   . chlorhexidine (PERIDEX) 0.12 % solution   Mouth/Throat   Use as directed 15 mLs in the mouth or throat every morning.         . diazepam (VALIUM) 10 MG tablet   Oral   Take 10 mg by mouth at bedtime.         . diazepam (VALIUM) 2 MG tablet   Oral   Take 2 mg by mouth 2 (two) times daily.         . diazepam (VALIUM) 5 MG tablet   Oral  Take 5 mg by mouth every morning.         . Folic Acid 0.8 MG CAPS   Oral   Take 0.8 mg by mouth daily.         Marland Kitchen loperamide (IMODIUM) 2 MG capsule   Oral   Take 1 capsule (2 mg total) by mouth 4 (four) times daily -  before meals and at bedtime.   30 capsule      . mesalamine (APRISO) 0.375 G 24 hr capsule      Take 4 capsules (1.5mg ) by mouth in the morning.  Do Not Crush.   120 capsule   2     Given to [employer] Programme researcher, broadcasting/film/video) at [employer] i ...   . Multiple Vitamins-Iron (MULTIVITAMINS WITH IRON) TABS   Oral   Take 1 tablet by mouth daily.         . Nutritional Supplements (ENSURE PO)   Oral   Take 1 Can by mouth 4 (four) times daily. Between meals         . ondansetron (ZOFRAN) 8 MG tablet    Oral   Take 8 mg by mouth every 8 (eight) hours as needed for nausea.          . ranitidine (ZANTAC) 150 MG tablet   Oral   Take 150 mg by mouth 2 (two) times daily.         . magnesium oxide (MAG-OX) 400 MG tablet   Oral   Take 400 mg by mouth daily.         . pantoprazole (PROTONIX) 40 MG tablet   Oral   Take 1 tablet (40 mg total) by mouth daily.   30 tablet   11   . PRESCRIPTION MEDICATION   Oral   Take 15 mLs by mouth daily. Psyllium laxative powder: Give 1 (one) tablespoonful in 8 ounces of water and drink once daily         . tiZANidine (ZANAFLEX) 4 MG capsule   Oral   Take 8 mg by mouth 4 (four) times daily.          BP 127/78  Pulse 93  Temp(Src) 99 F (37.2 C) (Oral)  Resp 24  SpO2 98% Physical Exam  Constitutional: He is oriented to person, place, and time.  Patient is lying on the bed and appears very thin and small for his age.  HENT:  Head: Normocephalic and atraumatic.  Eyes: Conjunctivae and EOM are normal. Pupils are equal, round, and reactive to light. No scleral icterus.  Neck: Neck supple.  Cardiovascular: Normal rate, regular rhythm, normal heart sounds and intact distal pulses.   Pulmonary/Chest: Effort normal and breath sounds normal.  Abdominal: Soft. Bowel sounds are normal.  Genitourinary: Rectum normal. Guaiac negative stool.  Musculoskeletal:  Patient is very spastic, especially in the lower extremities.    Neurological: He is alert and oriented to person, place, and time.  Skin: Skin is warm and dry.  Patient has facial acne.      ED Course  Procedures (including critical care time) Labs Reviewed  COMPREHENSIVE METABOLIC PANEL - Abnormal; Notable for the following:    Glucose, Bld 101 (*)    Calcium 8.0 (*)    All other components within normal limits  CBC WITH DIFFERENTIAL  LIPASE, BLOOD  URINALYSIS, ROUTINE W REFLEX MICROSCOPIC  OCCULT BLOOD, POC DEVICE   No results found. No diagnosis found.  MDM  Patient is  a 37 yowm with chronic lymphocytic  colitis who has been experiencing chronic abdominal pain and diarrhea.  We ordered a CMP which was normal except his calcium was slightly low at 8.0.  We gave him 1L of NS and spoke with Dr. Rhea Belton from GI and he recommended starting lomotil qid PRN for diarrhea and to f/u with Dr. Leone Payor next week.  Boykin Peek, MD 10/17/12 1021

## 2012-10-17 NOTE — ED Notes (Signed)
Pt talking with mother on phone. Resting quietly at the time. Vital signs stable. Friend at bedside. No signs of distress noted.

## 2012-10-17 NOTE — ED Notes (Signed)
[facility] at bedside for transport. Given Paediatric nurse. No questions.

## 2012-10-19 ENCOUNTER — Telehealth: Payer: Self-pay

## 2012-10-19 NOTE — Telephone Encounter (Signed)
Left message for [name] to call back to discuss moving up office visit if needed.  He is currently scheduled for 11/10/12

## 2012-10-19 NOTE — Telephone Encounter (Signed)
Message copied by Annett Fabian on Mon Oct 19, 2012  3:58 PM ------      Message from: Stan Head E      Created: Mon Oct 19, 2012  9:33 AM      Regarding: FW: ER call       Going to need an REV - I do not think it needs to be urgent      ----- Message -----         From: Beverley Fiedler, MD         Sent: 10/17/2012   9:56 AM           To: Rossie Muskrat, RN, Iva Boop, MD      Subject: ER call                                                  Patient seen ER for ongoing refractory diarrhea.      Labs okay with no evidence for acute renal insufficiency      ER doctor felt he looked well      He will continue to budesonide and scheduled loperamide      I recommended the addition of Lomotil 4 times a day when necessary      Mother would like patient to followup in the office to discuss azathioprine or 6-MP.      I did not restart prednisone because they did not feel it helped any more than other regimen.      JMP             ------

## 2012-10-20 NOTE — Telephone Encounter (Signed)
I spoke with Alan Benson at [facility].  He has had an improvement in diarrhea on imodium.  They will keep the appt for 11/10/12 3:15 for now.  They will call back for any changes

## 2012-11-10 ENCOUNTER — Ambulatory Visit (INDEPENDENT_AMBULATORY_CARE_PROVIDER_SITE_OTHER): Payer: Medicaid Other | Admitting: Internal Medicine

## 2012-11-10 ENCOUNTER — Encounter: Payer: Self-pay | Admitting: Internal Medicine

## 2012-11-10 VITALS — BP 120/80 | HR 60

## 2012-11-10 DIAGNOSIS — K5289 Other specified noninfective gastroenteritis and colitis: Secondary | ICD-10-CM

## 2012-11-10 DIAGNOSIS — K52832 Lymphocytic colitis: Secondary | ICD-10-CM

## 2012-11-10 NOTE — Progress Notes (Addendum)
  Subjective:    Patient ID: Alan Benson, male    DOB: 27-Feb-1976, 37 y.o.   MRN: [id number]  HPI Taisei returns with one of the staff at [facility]. Since last visit he is better with less diarrhea - he actually does not have bowel movements on some days (bowel diary reviewed). He is on loperamide, budesonide, cholestyramine and Apriso for his lymphocytic colitis. He also remains on a gluten-free diet. He is back to work and says quality of life is ok. He does sometimes have multiple stools in a row if he drinks cold liquids or he gets excited. He still gets occasional abdominal cramps.  Medications, allergies, past medical history, past surgical history, family history and social history are reviewed and updated in the EMR.  Review of Systems Has some abrasions on his knees - bandaged    Objective:   Physical Exam Middle-aged man with spastic CP NAD    Assessment & Plan:   Lymphocytic colitis  Cc: Marletta Lor, NP

## 2012-11-10 NOTE — Assessment & Plan Note (Signed)
Improved on current Tx - plan to continue all of this and gluten-free diet. He does not have celiac disease but may have gluten sensitivity and given how difficult it has been to improve his sxs - will continue all and then revisit in 3 months re: stopping gluten-free diet.

## 2012-11-10 NOTE — Patient Instructions (Addendum)
Follow up with [place] in 3 months.  Continue your gluten free diet.  Continue current medications.   I appreciate the opportunity to care for you.

## 2013-02-11 ENCOUNTER — Ambulatory Visit (INDEPENDENT_AMBULATORY_CARE_PROVIDER_SITE_OTHER): Payer: Medicaid Other | Admitting: Internal Medicine

## 2013-02-11 ENCOUNTER — Encounter: Payer: Self-pay | Admitting: Internal Medicine

## 2013-02-11 VITALS — BP 124/80 | HR 88

## 2013-02-11 DIAGNOSIS — K9041 Non-celiac gluten sensitivity: Secondary | ICD-10-CM

## 2013-02-11 DIAGNOSIS — K52832 Lymphocytic colitis: Secondary | ICD-10-CM

## 2013-02-11 DIAGNOSIS — K219 Gastro-esophageal reflux disease without esophagitis: Secondary | ICD-10-CM

## 2013-02-11 DIAGNOSIS — Z91018 Allergy to other foods: Secondary | ICD-10-CM

## 2013-02-11 DIAGNOSIS — K5289 Other specified noninfective gastroenteritis and colitis: Secondary | ICD-10-CM

## 2013-02-11 HISTORY — DX: Gastro-esophageal reflux disease without esophagitis: K21.9

## 2013-02-11 NOTE — Assessment & Plan Note (Signed)
Continue gluten free diet.  

## 2013-02-11 NOTE — Assessment & Plan Note (Signed)
Continue PPI ?

## 2013-02-11 NOTE — Patient Instructions (Addendum)
Please change your budesonide 3mg  capsules to 6mg  daily.  We have written this on your orders.  Please follow up with [place] in 3 months.  I appreciate the opportunity to care for you.

## 2013-02-11 NOTE — Assessment & Plan Note (Signed)
He is doing well at this time. I'm going to see if we can reduce his steroid dose - decrease to 6 milligrams daily on budesonide. I will have him return in 3 months. He will remain on a gluten-free diet as he does appear to be gluten sensitive, it's hard to tell but I really don't want to make too many changes other than reducing the budesonide given what he is gone through to get under control.

## 2013-02-11 NOTE — Progress Notes (Signed)
  Subjective:    Patient ID: Alan Benson, male    DOB: 1976/03/29, 37 y.o.   MRN: [id number]  HPI Alan Benson returns doing well without complaints with respect to his lymphocytic colitis. He has moved to the [facility] home since the [facility] is closing. He misses the bell house. 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 He is still on a gluten-free diet and tolerates that.  Medications, allergies, past medical history, past surgical history, family history and social history are reviewed and updated in the EMR.  Review of Systems As per history of present illness, continues to be wheelchair-bound motorized. He is on a short course of prednisone for bronchitis    Objective:   Physical Exam Middle-aged man appearing stated age, spastic cerebral palsy manifestation seen      Assessment & Plan:   1. Lymphocytic colitis   2. 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

## 2013-04-23 ENCOUNTER — Other Ambulatory Visit: Payer: Self-pay | Admitting: Internal Medicine

## 2013-05-14 ENCOUNTER — Ambulatory Visit (INDEPENDENT_AMBULATORY_CARE_PROVIDER_SITE_OTHER): Payer: Medicaid Other | Admitting: Internal Medicine

## 2013-05-14 ENCOUNTER — Encounter: Payer: Self-pay | Admitting: Internal Medicine

## 2013-05-14 VITALS — BP 122/80 | HR 64

## 2013-05-14 DIAGNOSIS — K9041 Non-celiac gluten sensitivity: Secondary | ICD-10-CM

## 2013-05-14 DIAGNOSIS — K5289 Other specified noninfective gastroenteritis and colitis: Secondary | ICD-10-CM

## 2013-05-14 DIAGNOSIS — Z91018 Allergy to other foods: Secondary | ICD-10-CM

## 2013-05-14 DIAGNOSIS — K52832 Lymphocytic colitis: Secondary | ICD-10-CM

## 2013-05-14 NOTE — Progress Notes (Signed)
         Subjective:    Patient ID: Alan Benson, male    DOB: 07-01-1975, 38 y.o.   MRN: [id number]  HPI Alan Benson reports he is having stools that are "hard but ok". Quality of life re: defecation is good. 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  Medications, allergies, past medical history, past surgical history, family history and social history are reviewed and updated in the EMR.   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

## 2013-05-14 NOTE — Assessment & Plan Note (Addendum)
Better - stools "hard but ok" Will reduce budesonide to 3 mg daily and continue Apriso, loperamide and cholestyramine. RTC April/May 2015 - if still ok stop budesonide

## 2013-05-14 NOTE — Assessment & Plan Note (Addendum)
No changes

## 2014-05-03 ENCOUNTER — Ambulatory Visit (INDEPENDENT_AMBULATORY_CARE_PROVIDER_SITE_OTHER): Payer: Medicaid Other | Admitting: Internal Medicine

## 2014-05-03 ENCOUNTER — Encounter: Payer: Self-pay | Admitting: Internal Medicine

## 2014-05-03 VITALS — BP 110/74 | HR 72

## 2014-05-03 DIAGNOSIS — K5289 Other specified noninfective gastroenteritis and colitis: Secondary | ICD-10-CM

## 2014-05-03 DIAGNOSIS — K52832 Lymphocytic colitis: Secondary | ICD-10-CM

## 2014-05-03 MED ORDER — BUDESONIDE 3 MG PO CP24: 3.0000 mg | ORAL_CAPSULE | Freq: Every day | ORAL | Status: DC

## 2014-05-03 NOTE — Assessment & Plan Note (Signed)
Stay on current Tx RTC 1 year

## 2014-05-03 NOTE — Patient Instructions (Signed)
Follow up with us in a year.   I appreciate the opportunity to care for you. Stan Headarl Gessner, M.D., [facility]

## 2014-05-05 NOTE — Progress Notes (Signed)
   Subjective:    Patient ID: Alan Benson, male    DOB: [birth year], 39 y.o.   MRN: [id number]  HPI Alan Benson is here for f/u of lymphocytic colitis. Current regimen is working well, he says. No diarrhea problems or unexpected bowel movements and no abdominal pain.  Medications, allergies, past medical history, past surgical history, family history and social history are reviewed and updated in the EMR.  Review of Systems As above He has moved to a new home and says he likes it, since [facility].    Objective:   Physical Exam  WM, in w/c - spastic CP changes    Assessment & Plan:  Lymphocytic colitis Stay on current Tx RTC 1 year

## 2015-06-07 ENCOUNTER — Encounter: Payer: Self-pay | Admitting: Internal Medicine

## 2015-06-07 ENCOUNTER — Ambulatory Visit (INDEPENDENT_AMBULATORY_CARE_PROVIDER_SITE_OTHER): Payer: Medicaid Other | Admitting: Internal Medicine

## 2015-06-07 VITALS — HR 68

## 2015-06-07 DIAGNOSIS — K52832 Lymphocytic colitis: Secondary | ICD-10-CM | POA: Diagnosis not present

## 2015-06-07 DIAGNOSIS — A09 Infectious gastroenteritis and colitis, unspecified: Secondary | ICD-10-CM

## 2015-06-07 DIAGNOSIS — K529 Noninfective gastroenteritis and colitis, unspecified: Secondary | ICD-10-CM

## 2015-06-07 NOTE — Patient Instructions (Signed)
   Please try and sign up for the MyChart.    I appreciate the opportunity to care for you. Stan Head, MD, [facility]

## 2015-06-07 NOTE — Progress Notes (Signed)
   Subjective:    Patient ID: Alan Benson, male    DOB: 1975/04/27, 40 y.o.   MRN: [id number] Chief complaint: Recent diarrhea HPI Alan Benson is here for follow-up. He has lymphocytic colitis and possibly gluten sensitivity. He is maintained on Pentasa and budesonide and probiotics. He was in the ER at [facility] recently because of a diarrheal illness associated with nausea and vomiting. He is returning to normal at this point. One of his group home workers is here with him and corroborates.  Medications, allergies, past medical history, past surgical history, family history and social history are reviewed and updated in the EMR.  Review of Systems As above    Objective:   Physical Exam Pulse 68  Ht   Wt  He is in no acute distress. 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 Abdomen is soft and nontender bowel sounds present Eyes anicteric He is alert and oriented 3 Mood and affect are appropriate.      Assessment & Plan:   1. Lymphocytic colitis   2. Gastroenteritis presumed infectious    He will continue his current regimen of Pentasa and 3 mg budesonide daily. He also uses cholestyramine to help control his diarrhea. He is on align. I will see him in 1 year routinely sooner as needed. It sounds like he is recovering from what I think was  an infectious gastroenteritis  CC: Marletta Lor, NP

## 2015-06-18 ENCOUNTER — Other Ambulatory Visit: Payer: Self-pay | Admitting: Internal Medicine

## 2015-06-19 ENCOUNTER — Other Ambulatory Visit: Payer: Self-pay

## 2015-06-19 MED ORDER — BUDESONIDE 3 MG PO CPEP: 3.0000 mg | ORAL_CAPSULE | Freq: Every day | ORAL | Status: DC

## 2015-09-29 ENCOUNTER — Other Ambulatory Visit: Payer: Self-pay | Admitting: Internal Medicine

## 2015-10-02 ENCOUNTER — Telehealth: Payer: Self-pay | Admitting: Internal Medicine

## 2015-10-03 MED ORDER — LOPERAMIDE HCL 2 MG PO CAPS: 2.0000 mg | ORAL_CAPSULE | Freq: Three times a day (TID) | ORAL | Status: DC

## 2015-10-03 NOTE — Addendum Note (Signed)
Addended by: SwazilandJORDAN, Aksh Swart E on: 10/03/2015 09:08 AM   Modules accepted: Orders

## 2015-10-03 NOTE — Telephone Encounter (Signed)
I sent in the lomotil and also called [facility] to make sure that they got the script. I sent Alan Benson a MyChart message back.

## 2015-10-23 ENCOUNTER — Telehealth: Payer: Self-pay

## 2015-10-23 ENCOUNTER — Encounter: Payer: Self-pay | Admitting: Internal Medicine

## 2015-10-23 MED ORDER — LOPERAMIDE HCL 2 MG PO CAPS: 2.0000 mg | ORAL_CAPSULE | Freq: Three times a day (TID) | ORAL | Status: DC

## 2015-10-23 NOTE — Telephone Encounter (Signed)
Pharmacy requesting refill on loperamide Hcl 2mg  capsules, please advise how many refills ok Sir?

## 2015-10-23 NOTE — Telephone Encounter (Signed)
Loperamide HCL refill sent in to [facility].

## 2015-10-23 NOTE — Telephone Encounter (Signed)
12  

## 2016-01-08 ENCOUNTER — Telehealth: Payer: Self-pay

## 2016-01-08 MED ORDER — BUDESONIDE 3 MG PO CPEP: 3.0000 mg | ORAL_CAPSULE | Freq: Every day | ORAL | 1 refills | Status: DC

## 2016-01-08 NOTE — Telephone Encounter (Signed)
Budesonide refill sent in.

## 2016-01-08 NOTE — Telephone Encounter (Signed)
Received refill request from [facility] for patients Budesonide, may I refill Sir?

## 2016-01-08 NOTE — Telephone Encounter (Signed)
Refill x 6 mos 

## 2016-03-12 ENCOUNTER — Encounter: Payer: Self-pay | Admitting: Internal Medicine

## 2016-03-15 ENCOUNTER — Ambulatory Visit: Payer: Medicaid Other | Admitting: Internal Medicine

## 2016-03-28 ENCOUNTER — Other Ambulatory Visit: Payer: Self-pay | Admitting: Internal Medicine

## 2016-03-28 NOTE — Telephone Encounter (Signed)
He has a January appointment to see you.

## 2016-03-28 NOTE — Telephone Encounter (Signed)
Refill x 4 If not scheduled he should see me in 2018 next avail

## 2016-03-28 NOTE — Telephone Encounter (Signed)
Please advise, thank you Sir. 

## 2016-04-09 ENCOUNTER — Encounter: Payer: Self-pay | Admitting: Internal Medicine

## 2016-04-10 MED ORDER — LOPERAMIDE HCL 2 MG PO CAPS: 2.0000 mg | ORAL_CAPSULE | Freq: Three times a day (TID) | ORAL | 11 refills | Status: DC

## 2016-05-13 ENCOUNTER — Encounter: Payer: Self-pay | Admitting: Internal Medicine

## 2016-05-13 ENCOUNTER — Ambulatory Visit (INDEPENDENT_AMBULATORY_CARE_PROVIDER_SITE_OTHER): Payer: Medicaid Other | Admitting: Internal Medicine

## 2016-05-13 ENCOUNTER — Telehealth: Payer: Self-pay

## 2016-05-13 VITALS — BP 110/70 | HR 60

## 2016-05-13 DIAGNOSIS — K219 Gastro-esophageal reflux disease without esophagitis: Secondary | ICD-10-CM

## 2016-05-13 DIAGNOSIS — K52832 Lymphocytic colitis: Secondary | ICD-10-CM

## 2016-05-13 MED ORDER — LOPERAMIDE HCL 2 MG PO CAPS: 2.0000 mg | ORAL_CAPSULE | Freq: Three times a day (TID) | ORAL | 11 refills | Status: DC

## 2016-05-13 MED ORDER — MESALAMINE ER 0.375 G PO CP24: 1500.0000 mg | ORAL_CAPSULE | Freq: Every day | ORAL | 11 refills | Status: DC

## 2016-05-13 NOTE — Progress Notes (Signed)
Alan Benson 40 y.o. 1975/06/02 [id number]  Assessment & Plan:   Encounter Diagnoses  Name Primary?  . Lymphocytic colitis Yes  . Gastroesophageal reflux disease without esophagitis    I have prescribed Apriso 1.5 g daily and loperamide 2mg  qid Not sure if there are formulary issues but I think it is important his regimen does not change. We worked this out over the years and it is best regimen to control diarrhea and maintain quality of life so do not want to change.  I would ask that any GI medications be Rxed through me so I can be aware and hopefully avoid relapse of sxs.  RTC 1 year routine sooner prn  Cc;Alan A Avbuere, MD   Subjective:   Chief Complaint: medication change problems  HPI Alan Benson is here with caregiver - he is living in an apartment now. He is ok but concerned about medication changes recently - saw PCP and loperamide dced - changed to Lomotil, Apriso changed to Asacol. He and caregiver are not sure why this was done but Alan Benson is aware that he had an ileus on Lomotil in past and he has been well on Apriso for years. GERD ok on PPI. Some urgency with defecation but formed and no diarrhea.  Current Meds  Medication Sig  . baclofen (LIORESAL) 20 MG tablet Take 20 mg by mouth 4 (four) times daily.  . bifidobacterium infantis (ALIGN) capsule Take 1 capsule by mouth daily. Reported on 06/07/2015  . budesonide (ENTOCORT EC) 3 MG 24 hr capsule TAKE 1 CAPSULE BY MOUTH THREE TIMES A DAY  . chlorhexidine (PERIDEX) 0.12 % solution Use as directed 15 mLs in the mouth or throat every morning.  . diazepam (VALIUM) 2 MG tablet Take 2 mg by mouth 2 (two) times daily. Reported on 06/07/2015  . diazepam (VALIUM) 5 MG tablet Take 5 mg by mouth at bedtime.  . Folic Acid 0.8 MG CAPS Take 2 capsules by mouth daily.   . Multiple Vitamins-Iron (MULTIVITAMINS WITH IRON) TABS Take 1 tablet by mouth daily. Reported on 06/07/2015  . ondansetron (ZOFRAN) 8 MG tablet Take by mouth.  Reported on 06/07/2015   Allergies  Allergen Reactions  . Penicillins     REACTION: Hives   Past Medical History:  Diagnosis Date  . Cerebral palsy (HCC)   . CNS disorder   . Diarrhea   . Failure to thrive in childhood   . GERD (gastroesophageal reflux disease) 02/11/2013  . Lymphocytic colitis 12/21/2004  . Mixed spastic athetoid cerebral palsy (HCC)   . Muscle spasms of head and/or neck   . Severe flexion contractures of all joints   . Varicose vein    Past Surgical History:  Procedure Laterality Date  . COLONOSCOPY  2006  . COLONOSCOPY WITH PROPOFOL N/A 08/12/2012   Procedure: COLONOSCOPY WITH PROPOFOL;  Surgeon: Iva Boop, MD;  Location: [facility];  Service: Endoscopy;  Laterality: N/A;  . ESOPHAGOGASTRODUODENOSCOPY (EGD) WITH PROPOFOL N/A 08/12/2012   Procedure: ESOPHAGOGASTRODUODENOSCOPY (EGD) WITH PROPOFOL;  Surgeon: Iva Boop, MD;  Location: [facility];  Service: Endoscopy;  Laterality: N/A;     Review of Systems Spastic cerebral palsy issues persist - in wheelchair - he is working on computers again  Objective:   Physical Exam BP 110/70 (BP Location: Right Arm, Patient Position: Sitting, Cuff Size: Normal)   Pulse 60  NAD - spastic CP changes as usual - in wheelchair   15 minutes time spent with patient > half in  counseling coordination of care

## 2016-05-13 NOTE — Telephone Encounter (Signed)
-----   Message from Iva Booparl E Gessner, MD sent at 05/13/2016 12:11 PM EST ----- Regarding: pharmacy Check with pharmacy to see if Apriso and loperamide are covered  PCP had rxed Asacol HD and Lomotil instead

## 2016-05-13 NOTE — Patient Instructions (Addendum)
   We have sent the following medications to your pharmacy for you to pick up at your convenience: Apriso and loperamide   We are also giving you some samples of Apriso.    Follow up with Dr Leone PayorGessner in a year or sooner if you need us.     I appreciate the opportunity to care for you. Stan Headarl Gessner, MD, [facility]

## 2016-05-13 NOTE — Telephone Encounter (Signed)
  Spoke with his pharmacy and seems to be no problem with the apriso or loperamide.  I told her he is to use these rx's instead of the Asacol HD and Lomotil from the PCP.  She didn't see those in the system.

## 2016-08-07 ENCOUNTER — Other Ambulatory Visit: Payer: Self-pay | Admitting: Internal Medicine

## 2017-01-23 ENCOUNTER — Telehealth: Payer: Self-pay

## 2017-01-23 NOTE — Telephone Encounter (Signed)
Received faxed request from [facility] for patient's Budesonide, please advise, thank you Sir?

## 2017-01-24 MED ORDER — BUDESONIDE 3 MG PO CPEP: ORAL_CAPSULE | ORAL | 5 refills | Status: DC

## 2017-01-24 NOTE — Telephone Encounter (Signed)
Refilled as approved. 

## 2017-01-24 NOTE — Telephone Encounter (Signed)
Refill x6

## 2017-04-16 ENCOUNTER — Other Ambulatory Visit: Payer: Self-pay | Admitting: Internal Medicine

## 2017-04-16 ENCOUNTER — Telehealth: Payer: Self-pay | Admitting: Internal Medicine

## 2017-04-16 NOTE — Telephone Encounter (Signed)
Refilled as requested  

## 2017-04-23 ENCOUNTER — Telehealth: Payer: Self-pay

## 2017-04-23 NOTE — Telephone Encounter (Signed)
Refill x 1 year 

## 2017-04-23 NOTE — Telephone Encounter (Signed)
Received fax for [facility]'s Apriso 0.375gm to be refilled, please advise. Thank you.

## 2017-04-24 MED ORDER — MESALAMINE ER 0.375 G PO CP24: ORAL_CAPSULE | ORAL | 11 refills | Status: DC

## 2017-04-24 NOTE — Telephone Encounter (Signed)
Apriso refilled for a year as approved.

## 2017-05-14 ENCOUNTER — Other Ambulatory Visit: Payer: Self-pay | Admitting: Internal Medicine

## 2017-05-15 NOTE — Telephone Encounter (Signed)
How many refills Sir? 

## 2017-05-15 NOTE — Telephone Encounter (Signed)
Refill x 1 year 

## 2017-06-05 ENCOUNTER — Encounter: Payer: Self-pay | Admitting: Internal Medicine

## 2017-06-05 ENCOUNTER — Ambulatory Visit: Payer: Medicaid Other | Admitting: Internal Medicine

## 2017-06-05 VITALS — BP 110/70 | HR 62 | Ht 67.0 in

## 2017-06-05 DIAGNOSIS — K52832 Lymphocytic colitis: Secondary | ICD-10-CM | POA: Diagnosis not present

## 2017-06-05 MED ORDER — MESALAMINE ER 0.375 G PO CP24: ORAL_CAPSULE | ORAL | 11 refills | Status: DC

## 2017-06-05 MED ORDER — LOPERAMIDE HCL 2 MG PO CAPS: 2.0000 mg | ORAL_CAPSULE | Freq: Three times a day (TID) | ORAL | 11 refills | Status: DC

## 2017-06-05 MED ORDER — BUDESONIDE 3 MG PO CPEP: ORAL_CAPSULE | ORAL | 5 refills | Status: DC

## 2017-06-05 NOTE — Patient Instructions (Addendum)
If you are age 43 or older, your body mass index should be between 23-30. Your Body mass index is 14.88 kg/m. If this is out of the aforementioned range listed, please consider follow up with your Primary Care Provider.  If you are age 43 or younger, your body mass index should be between 19-25. Your Body mass index is 14.88 kg/m. If this is out of the aformentioned range listed, please consider follow up with your Primary Care Provider.   We have sent the medications to your pharmacy for you to pick up at your convenience.  Follow up in one year or as needed.   Thank you for choosing [facility]  Dr. Stan Headarl Gessner

## 2017-06-05 NOTE — Progress Notes (Signed)
Alan Benson 41 y.o. April 03, 1976 [id number]  Assessment & Plan:  Lymphocytic colitis He will stay on current treatment with Apriso, budesonide and loperamide.  That seems to be working.  I will see him in a year sooner as needed.  He will monitor the sensation that he has to defecate it has only been there a week.  Examination of that area is quite difficult with his CP.  Hopefully will dissipate over time.  He will follow-up with his primary care about his nocturia.   I appreciate the opportunity to care for this patient. CC: Alan Benson, Ralph, MD    Subjective:   Chief Complaint: Follow-up of lymphocytic colitis  HPI Alan Benson is here with caregivers, he reports that for the past week or so he is felt like he has had to defecate much of the time but is not having diarrhea pain bleeding or anything like that.  For the past year he is urinating at night.  He says this does not seem like what his older father has had with his prostate.  He does not have any burning or dysuria.  He will talk with his primary care physician about that.  He is stable on his current regimen for his lymphocytic colitis. Allergies  Allergen Reactions  . Penicillins     REACTION: Hives   Current Meds  Medication Sig  . baclofen (LIORESAL) 20 MG tablet Take 20 mg by mouth 4 (four) times daily.  . budesonide (ENTOCORT EC) 3 MG 24 hr capsule TAKE 1 CAPSULE BY MOUTH THREE TIMES A DAY  . chlorhexidine (PERIDEX) 0.12 % solution Use as directed 15 mLs in the mouth or throat every morning.  . diazepam (VALIUM) 2 MG tablet Take 2 mg by mouth 2 (two) times daily. Reported on 06/07/2015  . diazepam (VALIUM) 5 MG tablet Take 5 mg by mouth at bedtime.  . Folic Acid 0.8 MG CAPS Take 2 capsules by mouth daily.   Marland Kitchen. loperamide (IMODIUM) 2 MG capsule Take 1 capsule (2 mg total) by mouth 4 (four) times daily -  before meals and at bedtime.  . mesalamine (APRISO) 0.375 g 24 hr capsule Take 4 capsules (1.5 g total) by mouth  daily.  . Multiple Vitamins-Iron (MULTIVITAMINS WITH IRON) TABS Take 1 tablet by mouth daily. Reported on 06/07/2015  . ondansetron (ZOFRAN) 8 MG tablet Take by mouth. Reported on 06/07/2015   Past Medical History:  Diagnosis Date  . Cerebral palsy (HCC)   . CNS disorder   . Diarrhea   . Failure to thrive in childhood   . GERD (gastroesophageal reflux disease) 02/11/2013  . Lymphocytic colitis 12/21/2004  . Mixed spastic athetoid cerebral palsy (HCC)   . Muscle spasms of head and/or neck   . Severe flexion contractures of all joints   . Varicose vein    Past Surgical History:  Procedure Laterality Date  . COLONOSCOPY  2006  . COLONOSCOPY WITH PROPOFOL N/A 08/12/2012   Procedure: COLONOSCOPY WITH PROPOFOL;  Surgeon: Iva Booparl E Gaston Dase, MD;  Location: [facility];  Service: Endoscopy;  Laterality: N/A;  . ESOPHAGOGASTRODUODENOSCOPY (EGD) WITH PROPOFOL N/A 08/12/2012   Procedure: ESOPHAGOGASTRODUODENOSCOPY (EGD) WITH PROPOFOL;  Surgeon: Iva Booparl E Kylina Vultaggio, MD;  Location: [facility];  Service: Endoscopy;  Laterality: N/A;   Social History   Social History Narrative   Single, disabled, lives in an apartment with help, full-time assistance.   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  No acute distress.   15 minutes time spent with patient > half in counseling coordination of care

## 2017-06-05 NOTE — Assessment & Plan Note (Signed)
He will stay on current treatment with Apriso, budesonide and loperamide.  That seems to be working.  I will see him in a year sooner as needed.  He will monitor the sensation that he has to defecate it has only been there a week.  Examination of that area is quite difficult with his CP.  Hopefully will dissipate over time.  He will follow-up with his primary care about his nocturia.

## 2017-06-21 ENCOUNTER — Other Ambulatory Visit: Payer: Self-pay | Admitting: Internal Medicine

## 2017-07-11 ENCOUNTER — Other Ambulatory Visit: Payer: Self-pay | Admitting: Internal Medicine

## 2017-12-09 ENCOUNTER — Telehealth: Payer: Self-pay | Admitting: Internal Medicine

## 2017-12-09 DIAGNOSIS — R109 Unspecified abdominal pain: Secondary | ICD-10-CM

## 2017-12-09 DIAGNOSIS — R194 Change in bowel habit: Secondary | ICD-10-CM

## 2017-12-09 NOTE — Telephone Encounter (Signed)
Well we need to find out if he is taking his meds because if not that is likely the problem  They need to do their best to see.  I do know that the place he was working was shut down so wonder if that is having an effect  It would be ok for him to see an APP and I can explain his situation if so - let me know

## 2017-12-09 NOTE — Telephone Encounter (Signed)
Judeth CornfieldStephanie, pt's care provider calling stating that pt is having a colitis flare up for about two weeks and needs to be seen asap. Pls call Judeth CornfieldStephanie at [phone].

## 2017-12-09 NOTE — Telephone Encounter (Signed)
Caregiver reports for about 2.5 weeks Alan Benson has complained about abdominal pain, alternating bowel habits from loose stool and constipation.  Your schedule is full for some time.  No APP appts until late next week. Caregiver is not certain but believes he is still taking budesonide 9 mg daily.  She is not sure if he is taking the imodium.  Her medtech is not available to speak with at this time.

## 2017-12-10 NOTE — Telephone Encounter (Signed)
I verified with the group home that he is taking Apriso, imodium, and entocort as ordered.  I put him on for first available APP 12/24/17.  They could not come in today at 4:00 due to transportation.  Do you want to make any changes until OV 9/11?

## 2017-12-10 NOTE — Telephone Encounter (Signed)
I would like him to get a 2 view abdomen CBC, CMET and lipase re: abd pain and change in bowel habits  He is w/c bound and the xray may be a challenge but would like to get that done if we can

## 2017-12-10 NOTE — Telephone Encounter (Signed)
Patient's caregiver notified.

## 2017-12-17 ENCOUNTER — Other Ambulatory Visit: Payer: Self-pay | Admitting: Internal Medicine

## 2017-12-17 ENCOUNTER — Other Ambulatory Visit (INDEPENDENT_AMBULATORY_CARE_PROVIDER_SITE_OTHER): Payer: Medicaid Other

## 2017-12-17 ENCOUNTER — Ambulatory Visit (INDEPENDENT_AMBULATORY_CARE_PROVIDER_SITE_OTHER)
Admission: RE | Admit: 2017-12-17 | Discharge: 2017-12-17 | Disposition: A | Discharge status: Home / Self Care | Payer: Medicaid Other | Source: Ambulatory Visit | Attending: Internal Medicine | Admitting: Internal Medicine

## 2017-12-17 DIAGNOSIS — R194 Change in bowel habit: Secondary | ICD-10-CM | POA: Diagnosis not present

## 2017-12-17 DIAGNOSIS — R109 Unspecified abdominal pain: Secondary | ICD-10-CM

## 2017-12-17 LAB — CBC WITH DIFFERENTIAL/PLATELET
BASOS PCT: 1.2 % (ref 0.0–3.0)
Basophils Absolute: 0.1 10*3/uL (ref 0.0–0.1)
EOS PCT: 2.7 % (ref 0.0–5.0)
Eosinophils Absolute: 0.2 10*3/uL (ref 0.0–0.7)
HCT: 42.9 % (ref 39.0–52.0)
HEMOGLOBIN: 14.8 g/dL (ref 13.0–17.0)
LYMPHS ABS: 1.5 10*3/uL (ref 0.7–4.0)
Lymphocytes Relative: 20.4 % (ref 12.0–46.0)
MCHC: 34.5 g/dL (ref 30.0–36.0)
MCV: 86.9 fl (ref 78.0–100.0)
Monocytes Absolute: 0.6 10*3/uL (ref 0.1–1.0)
Monocytes Relative: 8.7 % (ref 3.0–12.0)
Neutro Abs: 4.9 10*3/uL (ref 1.4–7.7)
Neutrophils Relative %: 67 % (ref 43.0–77.0)
Platelets: 339 10*3/uL (ref 150.0–400.0)
RBC: 4.94 Mil/uL (ref 4.22–5.81)
RDW: 12.8 % (ref 11.5–15.5)
WBC: 7.3 10*3/uL (ref 4.0–10.5)

## 2017-12-17 LAB — COMPREHENSIVE METABOLIC PANEL
ALT: 15 U/L (ref 0–53)
AST: 18 U/L (ref 0–37)
Albumin: 3.9 g/dL (ref 3.5–5.2)
Alkaline Phosphatase: 84 U/L (ref 39–117)
BILIRUBIN TOTAL: 0.9 mg/dL (ref 0.2–1.2)
BUN: 13 mg/dL (ref 6–23)
CO2: 33 mEq/L — ABNORMAL HIGH (ref 19–32)
CREATININE: 0.95 mg/dL (ref 0.40–1.50)
Calcium: 9.1 mg/dL (ref 8.4–10.5)
Chloride: 101 mEq/L (ref 96–112)
GFR: 92.1 mL/min (ref >60.00)
GLUCOSE: 94 mg/dL (ref 70–99)
Potassium: 5.4 mEq/L — ABNORMAL HIGH (ref 3.5–5.1)
SODIUM: 140 meq/L (ref 135–145)
TOTAL PROTEIN: 7.4 g/dL (ref 6.0–8.3)

## 2017-12-17 LAB — LIPASE: LIPASE: 15 U/L (ref 11.0–59.0)

## 2017-12-18 NOTE — Progress Notes (Signed)
Labs are ok except mildly high potassium  The xray is stable - like it has been  Please get a sx update

## 2017-12-24 ENCOUNTER — Ambulatory Visit: Payer: Medicaid Other | Admitting: Physician Assistant

## 2017-12-26 ENCOUNTER — Other Ambulatory Visit: Payer: Self-pay

## 2017-12-26 ENCOUNTER — Telehealth: Payer: Self-pay | Admitting: Internal Medicine

## 2017-12-26 MED ORDER — DICYCLOMINE HCL 20 MG PO TABS: 20.0000 mg | ORAL_TABLET | Freq: Three times a day (TID) | ORAL | 0 refills | Status: DC

## 2017-12-26 NOTE — Telephone Encounter (Signed)
Prescription resent to patient's pharmacy. 

## 2017-12-26 NOTE — Progress Notes (Signed)
Add dicyclomine 20 mg tid ac # 90 no refill  Am ccing Jennifer to let her know to contact me for guidance before she sees him 9/17

## 2017-12-30 ENCOUNTER — Other Ambulatory Visit: Payer: Self-pay | Admitting: Internal Medicine

## 2017-12-30 ENCOUNTER — Ambulatory Visit: Payer: Medicaid Other | Admitting: Physician Assistant

## 2017-12-30 NOTE — Telephone Encounter (Signed)
Dr. Leone PayorGessner, can we refill diazepam?

## 2018-01-01 ENCOUNTER — Ambulatory Visit (INDEPENDENT_AMBULATORY_CARE_PROVIDER_SITE_OTHER): Payer: Medicaid Other | Admitting: Physician Assistant

## 2018-01-01 ENCOUNTER — Encounter: Payer: Self-pay | Admitting: Physician Assistant

## 2018-01-01 VITALS — BP 120/80

## 2018-01-01 DIAGNOSIS — K52832 Lymphocytic colitis: Secondary | ICD-10-CM

## 2018-01-01 NOTE — Patient Instructions (Addendum)
Stop Dicyclomine, restart 1/2 tab daily if you start having diarrhea again.

## 2018-01-01 NOTE — Telephone Encounter (Signed)
Yes for 3 months total

## 2018-01-01 NOTE — Progress Notes (Signed)
Chief Complaint: Lymphocytic colitis  HPI:    Mr. Strey is a 42 year old male with a past medical history of spastic cerebral palsy, known to Dr. Concha Se for his history of lymphocytic colitis, who presents to clinic today with a complaint of change in bowel habits.    06/05/2017 office visit with Dr. Concha Se.  At that time patient was doing well with a pre-so, budesonide and loperamide on a daily basis.    12/17/2017 labs by Dr. Concha Se including CBC, CMP and lipase.  These were normal and patient was told to start dicyclomine 20 mg 3 times daily before meals for described increase in diarrhea.    Today, patient tells me that he has continued his Apriso, Budesonide and Loperamide on a daily basis.  He just started using Dicyclomine 20 mg 3 times daily before meals 2 days ago and has not had a bowel movement in the past 2 days.  Describes some accompanying stomach cramps and bloating over the past couple of days.  Prior to this patient tells me he was having at least 2 urgent stools a day.  Tells me he used Lomotil prior to this and this had a lot of side effects including stomach cramps and dry mouth.    Social history positive for changing jobs back in December of last year.    Denies fever, chills, weight loss, blood in his stool or symptoms that awaken him from sleep.  Past Medical History:  Diagnosis Date  . Cerebral palsy (HCC)   . CNS disorder   . Diarrhea   . Failure to thrive in childhood   . GERD (gastroesophageal reflux disease) 02/11/2013  . Lymphocytic colitis 12/21/2004  . Mixed spastic athetoid cerebral palsy (HCC)   . Muscle spasms of head and/or neck   . Severe flexion contractures of all joints   . Varicose vein     Past Surgical History:  Procedure Laterality Date  . COLONOSCOPY  2006  . COLONOSCOPY WITH PROPOFOL N/A 08/12/2012   Procedure: COLONOSCOPY WITH PROPOFOL;  Surgeon: Iva Boop, MD;  Location: [facility];  Service: Endoscopy;  Laterality: N/A;  .  ESOPHAGOGASTRODUODENOSCOPY (EGD) WITH PROPOFOL N/A 08/12/2012   Procedure: ESOPHAGOGASTRODUODENOSCOPY (EGD) WITH PROPOFOL;  Surgeon: Iva Boop, MD;  Location: [facility];  Service: Endoscopy;  Laterality: N/A;    Current Outpatient Medications  Medication Sig Dispense Refill  . baclofen (LIORESAL) 20 MG tablet Take 20 mg by mouth 4 (four) times daily.    . budesonide (ENTOCORT EC) 3 MG 24 hr capsule TAKE 1 CAPSULE BY MOUTH THREE TIMES A DAY 90 capsule 5  . chlorhexidine (PERIDEX) 0.12 % solution Use as directed 15 mLs in the mouth or throat every morning.    . diazepam (VALIUM) 5 MG tablet Take 5 mg by mouth at bedtime.    . dicyclomine (BENTYL) 20 MG tablet Take 1 tablet (20 mg total) by mouth 3 (three) times daily before meals. 90 tablet 0  . Folic Acid 0.8 MG CAPS Take 2 capsules by mouth daily.     Marland Kitchen loperamide (IMODIUM) 2 MG capsule Take 1 capsule (2 mg total) by mouth 4 (four) times daily -  before meals and at bedtime. 120 capsule 11  . mesalamine (APRISO) 0.375 g 24 hr capsule Take 4 capsules (1.5 g total) by mouth daily. 120 capsule 11  . Multiple Vitamins-Iron (MULTIVITAMINS WITH IRON) TABS Take 1 tablet by mouth daily. Reported on 06/07/2015    . ondansetron (ZOFRAN) 8 MG  tablet Take by mouth. Reported on 06/07/2015     No current facility-administered medications for this visit.     Allergies as of 01/01/2018 - Review Complete 01/01/2018  Allergen Reaction Noted  . Penicillins  02/16/2007    History reviewed. No pertinent family history.  Social History   Socioeconomic History  . Marital status: Single    Spouse name: Not on file  . Number of children: 0  . Years of education: Not on file  . Highest education level: Not on file  Occupational History  . Occupation: Disabled  Social Needs  . Financial resource strain: Not on file  . Food insecurity:    Worry: Not on file    Inability: Not on file  . Transportation needs:    Medical: Not on file     Non-medical: Not on file  Tobacco Use  . Smoking status: Never Smoker  . Smokeless tobacco: Never Used  Substance and Sexual Activity  . Alcohol use: No  . Drug use: No  . Sexual activity: Never  Lifestyle  . Physical activity:    Days per week: Not on file    Minutes per session: Not on file  . Stress: Not on file  Relationships  . Social connections:    Talks on phone: Not on file    Gets together: Not on file    Attends religious service: Not on file    Active member of club or organization: Not on file    Attends meetings of clubs or organizations: Not on file    Relationship status: Not on file  . Intimate partner violence:    Fear of current or ex partner: Not on file    Emotionally abused: Not on file    Physically abused: Not on file    Forced sexual activity: Not on file  Other Topics Concern  . Not on file  Social History Narrative   Single, disabled, lives in an apartment with help, full-time assistance.   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 Lungs clear to auscultation bilaterally.   No wheezes, crackles, or rhonchi.  Cardiovascular: Normal S1, S2. No MRG. Regular rate and rhythm. No peripheral edema, cyanosis or pallor.  Gastrointestinal:  Soft, nondistended, nontender. No rebound or guarding. Normal bowel sounds. No appreciable masses or hepatomegaly. Rectal:  Not performed.  Psychiatric:  Demonstrates good judgement and reason without abnormal affect or behaviors.  MOST RECENT LABS AND IMAGING: CBC    Component Value Date/Time   WBC 7.3 12/17/2017  1453   RBC 4.94 12/17/2017 1453   HGB 14.8 12/17/2017 1453   HCT 42.9 12/17/2017 1453   PLT 339.0 12/17/2017 1453   MCV 86.9 12/17/2017 1453   MCH 30.0 10/17/2012 0758   MCHC 34.5 12/17/2017 1453   RDW 12.8 12/17/2017 1453   LYMPHSABS 1.5 12/17/2017 1453   MONOABS 0.6 12/17/2017 1453   EOSABS 0.2 12/17/2017 1453   BASOSABS 0.1 12/17/2017 1453    CMP     Component Value Date/Time   NA 140 12/17/2017 1453  K 5.4 (H) 12/17/2017 1453   CL 101 12/17/2017 1453   CO2 33 (H) 12/17/2017 1453   GLUCOSE 94 12/17/2017 1453   BUN 13 12/17/2017 1453   CREATININE 0.95 12/17/2017 1453   CALCIUM 9.1 12/17/2017 1453   PROT 7.4 12/17/2017 1453   ALBUMIN 3.9 12/17/2017 1453   AST 18 12/17/2017 1453   ALT 15 12/17/2017 1453   ALKPHOS 84 12/17/2017 1453   BILITOT 0.9 12/17/2017 1453   GFRNONAA >90 10/17/2012 0902   GFRAA >90 10/17/2012 0902   Assessment: 1.  Lymphocytic colitis: Maintained on Apriso, Loperamide and Budesonide, recently with an increase in loose stools, two urgent stools a day, started Dicyclomine 20 mg 3 times daily before meals and has not had a bowel movement in the past 2 days while using this medication; Question IBS ? Vs lymphocytic colitis  Plan: 1.  Discussed with patient I feel like the Dicyclomine is too strong for him.  Would recommend that he discontinue this medication.  When he starts back with bowel movements, if they are still loose would recommend that he use a half a tab daily for now and see how this works for him.  He can always titrate up to more times a day if needed. 2.  Patient to follow in clinic with Dr. Leone Payor as needed in the future.  Hyacinth Meeker, PA-C [facility] 01/01/2018, 2:53 PM  Cc: Mattie Marlin, MD

## 2018-01-02 ENCOUNTER — Other Ambulatory Visit: Payer: Self-pay | Admitting: Internal Medicine

## 2018-01-02 NOTE — Telephone Encounter (Signed)
Dr Leone PayorGessner please advise if these refills are ok to fill for this patient

## 2018-01-07 NOTE — Telephone Encounter (Signed)
Refill x 1 year 

## 2018-01-28 ENCOUNTER — Other Ambulatory Visit: Payer: Self-pay | Admitting: Internal Medicine

## 2018-01-28 NOTE — Telephone Encounter (Signed)
How many refills Sir, thank you. 

## 2018-01-30 NOTE — Telephone Encounter (Signed)
Dicyclomine can be refilled  I do not Rx his baclofen  If he is in a bind we can do that x 1 but really should go to PCP/other MD

## 2018-01-30 NOTE — Telephone Encounter (Signed)
I called and spoke with pharmacy. Okayed the generic Bentyl and told them to send the baclofen to his PCP. She did that while we were on the phone together.

## 2018-02-25 ENCOUNTER — Other Ambulatory Visit: Payer: Self-pay | Admitting: Internal Medicine

## 2018-02-25 NOTE — Telephone Encounter (Signed)
Okay to refill both Sir? 

## 2018-03-25 ENCOUNTER — Other Ambulatory Visit: Payer: Self-pay | Admitting: Internal Medicine

## 2018-03-25 NOTE — Telephone Encounter (Signed)
Refill dicyclomine x 1 year  PCP needs to refill diazepam - I believe that is what I said last time also so not sure how my name staid attached  Please tell pharmacy to send the refill to Dr. Mattie Marlinalph Willett - PCP

## 2018-03-25 NOTE — Telephone Encounter (Signed)
Dr. Leone PayorGessner, do you want to refill diazepam?

## 2018-04-01 DIAGNOSIS — H9311 Tinnitus, right ear: Secondary | ICD-10-CM | POA: Insufficient documentation

## 2018-04-16 DIAGNOSIS — R197 Diarrhea, unspecified: Secondary | ICD-10-CM | POA: Insufficient documentation

## 2018-04-16 DIAGNOSIS — R5381 Other malaise: Secondary | ICD-10-CM | POA: Insufficient documentation

## 2018-05-18 ENCOUNTER — Ambulatory Visit (INDEPENDENT_AMBULATORY_CARE_PROVIDER_SITE_OTHER): Payer: Medicaid Other | Admitting: Physician Assistant

## 2018-05-18 ENCOUNTER — Encounter: Payer: Self-pay | Admitting: Physician Assistant

## 2018-05-18 VITALS — BP 132/84 | HR 72

## 2018-05-18 DIAGNOSIS — K52832 Lymphocytic colitis: Secondary | ICD-10-CM

## 2018-05-18 DIAGNOSIS — K580 Irritable bowel syndrome with diarrhea: Secondary | ICD-10-CM

## 2018-05-18 NOTE — Progress Notes (Addendum)
Chief Complaint: Diarrhea and abdominal pain  HPI:    [name] is a 43 year old Caucasian male with a past medical history of spastic cerebral palsy, known to Alan Benson for his history of lymphocytic colitis and IBS, who presents clinic today with a complaint of some increase in loose stools and abdominal pain.    01/01/2018 last office visit for an increase in some loose stools which had recently become constipation after increasing dicyclomine.  At that time, recommend the patient decrease Dicyclomine to half tab per day and titrate up to more times a day if needed.    Today, patient tells me that he continues on all of his chronic medicines including Loperamide 2 mg 4 times a day, Apriso 4 capsules by mouth daily and Budesonide 3 mg 3 times a day.  Currently only using Dicyclomine 20 mg, a half tab every morning.  Explains that he has noticed when he gets excited or nervous about something he will develop some lower abdominal pain and likely have a loose stool after, may be 2 times a day at most.  Pain is typically relieved after a bowel movement.    Denies fever, chills, blood in his stool or symptoms that awaken him from sleep.  Past Medical History:  Diagnosis Date  . Cerebral palsy (HCC)   . CNS disorder   . Diarrhea   . Failure to thrive in childhood   . GERD (gastroesophageal reflux disease) 02/11/2013  . Lymphocytic colitis 12/21/2004  . Mixed spastic athetoid cerebral palsy (HCC)   . Muscle spasms of head and/or neck   . Severe flexion contractures of all joints   . Varicose vein     Past Surgical History:  Procedure Laterality Date  . COLONOSCOPY  2006  . COLONOSCOPY WITH PROPOFOL N/A 08/12/2012   Procedure: COLONOSCOPY WITH PROPOFOL;  Surgeon: Alan Booparl E Gessner, MD;  Location: [facility];  Service: Endoscopy;  Laterality: N/A;  . ESOPHAGOGASTRODUODENOSCOPY (EGD) WITH PROPOFOL N/A 08/12/2012   Procedure: ESOPHAGOGASTRODUODENOSCOPY (EGD) WITH PROPOFOL;  Surgeon: Alan Booparl E  Gessner, MD;  Location: [facility];  Service: Endoscopy;  Laterality: N/A;    Current Outpatient Medications  Medication Sig Dispense Refill  . baclofen (LIORESAL) 20 MG tablet TAKE 1 TABLET BY MOUTH FOUR TIMES A DAY 120 tablet 0  . budesonide (ENTOCORT EC) 3 MG 24 hr capsule TAKE 1 CAPSULE BY MOUTH THREE TIMES A DAY 90 capsule 5  . chlorhexidine (PERIDEX) 0.12 % solution RINSE & SPIT 15 MLS BY MOUTH EVERY IN THE MORNING 473 mL 11  . diazepam (VALIUM) 2 MG tablet TAKE 1 TABLET BY MOUTH EVERY TWELVE HOURS AS NEEDED FOR ANXIETY 60 tablet 2  . diazepam (VALIUM) 5 MG tablet Take 5 mg by mouth at bedtime.    . dicyclomine (BENTYL) 20 MG tablet TAKE 1 TABLET BY MOUTH THREE TIMES A DAY BEFORE MEALS 90 tablet 11  . Folic Acid 0.8 MG CAPS Take 2 capsules by mouth daily.     Marland Kitchen. loperamide (IMODIUM) 2 MG capsule Take 1 capsule (2 mg total) by mouth 4 (four) times daily -  before meals and at bedtime. 120 capsule 11  . mesalamine (APRISO) 0.375 g 24 hr capsule Take 4 capsules (1.5 g total) by mouth daily. 120 capsule 11  . Multiple Vitamin (MULTI-VITAMINS) TABS TAKE 1 TABLET BY MOUTH DAILY 90 tablet 0  . Multiple Vitamins-Iron (MULTIVITAMINS WITH IRON) TABS Take 1 tablet by mouth daily. Reported on 06/07/2015    . ondansetron (ZOFRAN)  8 MG tablet TAKE 1 TABLET BY MOUTH EVERY MORNING AS NEEDED 30 tablet 11   No current facility-administered medications for this visit.     Allergies as of 05/18/2018 - Review Complete 01/01/2018  Allergen Reaction Noted  . Penicillins  02/16/2007    No family history on file.  Social History   Socioeconomic History  . Marital status: Single    Spouse name: Not on file  . Number of children: 0  . Years of education: Not on file  . Highest education level: Not on file  Occupational History  . Occupation: Disabled  Social Needs  . Financial resource strain: Not on file  . Food insecurity:    Worry: Not on file    Inability: Not on file  . Transportation  needs:    Medical: Not on file    Non-medical: Not on file  Tobacco Use  . Smoking status: Never Smoker  . Smokeless tobacco: Never Used  Substance and Sexual Activity  . Alcohol use: No  . Drug use: No  . Sexual activity: Never  Lifestyle  . Physical activity:    Days per week: Not on file    Minutes per session: Not on file  . Stress: Not on file  Relationships  . Social connections:    Talks on phone: Not on file    Gets together: Not on file    Attends religious service: Not on file    Active member of club or organization: Not on file    Attends meetings of clubs or organizations: Not on file    Relationship status: Not on file  . Intimate partner violence:    Fear of current or ex partner: Not on file    Emotionally abused: Not on file    Physically abused: Not on file    Forced sexual activity: Not on file  Other Topics Concern  . Not on file  Social History Narrative   Single, disabled, lives in an apartment with help, full-time assistance.   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 Lungs clear to auscultation bilaterally.   No wheezes, crackles, or rhonchi.  Cardiovascular: Normal S1, S2. No MRG. Regular rate and rhythm. No peripheral edema, cyanosis or pallor.  Gastrointestinal:  Soft, nondistended, nontender. No rebound or guarding. Normal bowel sounds. No appreciable masses or hepatomegaly. Psychiatric: Demonstrates good judgement and reason without abnormal affect or behaviors.  No recent  labs or imaging.  Assessment: 1.  IBS-D: increase in lower abdominal cramping and loose stools recently with anxiety or excitement 2.  Lymphocytic colitis  Plan: 1.  Discussed with patient that likely this is an increase in his IBS-D symptoms given that his symptoms typically happen around times of anxiety or excitement that are preceded by abdominal pain which is relieved afterwards. 2.  Explained that he should increase his Dicyclomine to 20 mg daily, 20-30 minutes before breakfast.  Did try to explain titration from there.  Patient can take this up to  4 times a day before meals if needed, but stool should be observed, if he becomes constipated they can back off on this medication. 3.  Continue Apriso, Budesonide and Imodium 4.  Patient to follow in clinic with me or Alan Benson as needed in the future.  Alan MeekerJennifer Daneen Volcy, PA-C  Gastroenterology 05/18/2018, 3:02 PM  Cc: Mattie MarlinWillett, Ralph, MD   Agree with Ms. Alton Tremblay's evaluation and management.  Alan Booparl E. Gessner, MD, Clementeen GrahamFACG

## 2018-05-18 NOTE — Patient Instructions (Addendum)
Increase Dicyclomine to 20mg  daily 20-30 minutes before breakfast

## 2018-06-18 ENCOUNTER — Other Ambulatory Visit: Payer: Self-pay | Admitting: Internal Medicine

## 2018-06-18 NOTE — Telephone Encounter (Signed)
Refill x 1 year 

## 2018-06-18 NOTE — Telephone Encounter (Signed)
Please advise Sir? 

## 2018-12-07 DIAGNOSIS — R252 Cramp and spasm: Secondary | ICD-10-CM | POA: Insufficient documentation

## 2019-03-26 ENCOUNTER — Other Ambulatory Visit: Payer: Self-pay | Admitting: Internal Medicine

## 2019-04-12 ENCOUNTER — Other Ambulatory Visit: Payer: Self-pay | Admitting: Internal Medicine

## 2019-04-12 MED ORDER — DIPHENOXYLATE-ATROPINE 2.5-0.025 MG PO TABS: ORAL_TABLET | ORAL | 0 refills | Status: DC

## 2019-04-13 ENCOUNTER — Other Ambulatory Visit: Payer: Self-pay | Admitting: Internal Medicine

## 2019-04-13 MED ORDER — VIBERZI 75 MG PO TABS: 75.0000 mg | ORAL_TABLET | Freq: Two times a day (BID) | ORAL | 1 refills | Status: DC

## 2019-04-13 NOTE — Progress Notes (Signed)
He reminded me he had problems with Lomotil Will see if he can use Viberzi  May need a prior auth

## 2019-04-15 ENCOUNTER — Other Ambulatory Visit: Payer: Self-pay | Admitting: Internal Medicine

## 2019-04-15 MED ORDER — VIBERZI 75 MG PO TABS: 75.0000 mg | ORAL_TABLET | Freq: Two times a day (BID) | ORAL | 1 refills | Status: DC

## 2019-04-29 ENCOUNTER — Ambulatory Visit (INDEPENDENT_AMBULATORY_CARE_PROVIDER_SITE_OTHER): Payer: Medicaid Other | Admitting: Physician Assistant

## 2019-04-29 ENCOUNTER — Encounter: Payer: Self-pay | Admitting: Physician Assistant

## 2019-04-29 VITALS — BP 140/82

## 2019-04-29 DIAGNOSIS — K52832 Lymphocytic colitis: Secondary | ICD-10-CM | POA: Diagnosis not present

## 2019-04-29 DIAGNOSIS — R109 Unspecified abdominal pain: Secondary | ICD-10-CM

## 2019-04-29 DIAGNOSIS — K580 Irritable bowel syndrome with diarrhea: Secondary | ICD-10-CM

## 2019-04-29 NOTE — Progress Notes (Signed)
Chief Complaint: Generalized abdominal pain and diarrhea  HPI:    [name] is a 44 year old Caucasian male with a past medical history of spastic cerebral palsy, known to Dr. Carlean Benson for his history of lymphocytic colitis and IBS, who presents clinic today with a complaint of abdominal pain and diarrhea.    06/04/2018 last office visit.  Patient was still on loperamide 2 mg 4 times a day, Apriso 4 capsules by mouth daily and budesonide 3 mg 3 times a day and using dicyclomine 20 mg a half tab every morning.  At that time increase his dicyclomine to 20 mg daily.    04/10/2019 patient contacted Dr. Carlean Benson to describe a flare of symptoms.  At that time he was sent in [place].    Today, the patient presents clinic and explains that 3 weeks ago he had a flareup of symptoms which lasted for a week.  He tells me he had a lot of generalized abdominal pain and increase in diarrhea.  This lasted for about a week and then he became more constipated.  He tells me that currently he has not had a bowel movement in 4 days.  He is using his Imodium 4 times a day and Dicyclomine as prescribed currently.  Patient has not started Viberzi which was called in by Dr. Carlean Benson as above.  Tells me that everything seems to be calming down but now he is constipated.    Denies fever, chills, blood in his stool or symptoms that awaken him from sleep.  Past Medical History:  Diagnosis Date  . Cerebral palsy ([place])   . CNS disorder   . Diarrhea   . Failure to thrive in childhood   . GERD (gastroesophageal reflux disease) 02/11/2013  . Lymphocytic colitis 12/21/2004  . Mixed spastic athetoid cerebral palsy ([place])   . Muscle spasms of head and/or neck   . Severe flexion contractures of all joints   . Varicose vein     Past Surgical History:  Procedure Laterality Date  . COLONOSCOPY  2006  . COLONOSCOPY WITH PROPOFOL N/A 08/12/2012   Procedure: COLONOSCOPY WITH PROPOFOL;  Surgeon: Alan Mayer, MD;  [facility]: [facility];  Service: Endoscopy;  Laterality: N/A;  . ESOPHAGOGASTRODUODENOSCOPY (EGD) WITH PROPOFOL N/A 08/12/2012   Procedure: ESOPHAGOGASTRODUODENOSCOPY (EGD) WITH PROPOFOL;  Surgeon: Alan Mayer, MD;  [facility];  Service: Endoscopy;  Laterality: N/A;    Current Outpatient Medications  Medication Sig Dispense Refill  . baclofen (LIORESAL) 20 MG tablet TAKE 1 TABLET BY MOUTH FOUR TIMES A DAY 120 tablet 0  . budesonide (ENTOCORT EC) 3 MG 24 hr capsule TAKE 1 CAPSULE BY MOUTH THREE TIMES A DAY 90 capsule 5  . chlorhexidine (PERIDEX) 0.12 % solution RINSE & SPIT 15 MLS BY MOUTH EVERY IN THE MORNING 473 mL 11  . diazepam (VALIUM) 2 MG tablet TAKE 1 TABLET BY MOUTH EVERY TWELVE HOURS AS NEEDED FOR ANXIETY 60 tablet 2  . diazepam (VALIUM) 5 MG tablet Take 5 mg by mouth at bedtime.    . dicyclomine (BENTYL) 20 MG tablet TAKE 1 TABLET BY MOUTH THREE TIMES A DAY BEFORE MEALS 90 tablet 2  . Folic Acid 0.8 MG CAPS Take 2 capsules by mouth daily.     Marland Kitchen loperamide (IMODIUM) 2 MG capsule TAKE 1 CAPSULE BY MOUTH FOUR TIMES A DAY BEFORE MEALS & AT BEDTIME 120 capsule 11  . mesalamine (APRISO) 0.375 g 24 hr capsule TAKE 4 CAPSULES (1.5 G TOTAL) BY  MOUTH DAILY. 120 capsule 11  . Multiple Vitamin (MULTI-VITAMINS) TABS TAKE 1 TABLET BY MOUTH DAILY 90 tablet 0  . ondansetron (ZOFRAN) 8 MG tablet TAKE 1 TABLET BY MOUTH EVERY MORNING AS NEEDED 30 tablet 11  . Eluxadoline (VIBERZI) 75 MG TABS Take 75 mg by mouth 2 (two) times daily. (Patient not taking: Reported on 04/29/2019) 60 tablet 1   No current facility-administered medications for this visit.    Allergies as of 04/29/2019 - Review Complete 04/29/2019  Allergen Reaction Noted  . Penicillins  02/16/2007    No family history on file.  Social History   Socioeconomic History  . Marital status: Single    Spouse name: Not on file  . Number of children: 0  . Years of education: Not on file  . Highest education level: Not on file    Occupational History  . Occupation: Disabled  Tobacco Use  . Smoking status: Never Smoker  . Smokeless tobacco: Never Used  Substance and Sexual Activity  . Alcohol use: No  . Drug use: No  . Sexual activity: Never  Other Topics Concern  . Not on file  Social History Narrative   Single, disabled, lives in an apartment with help, full-time assistance.   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 Difficulty of Paying Living Expenses: Not on file  Food Insecurity:   . Worried About Programme researcher, broadcasting/film/video in the Last Year: Not on file  . Ran Out of Food in the Last Year: Not on file  Transportation Needs:   . Lack of Transportation (Medical): Not on file  . Lack of Transportation (Non-Medical): Not on file  Physical Activity:   . Days of Exercise per Week: Not on file  . Minutes of Exercise per Session: Not on file  Stress:   . Feeling of Stress : Not on file  Social Connections:   . Frequency of Communication with Friends and Family: Not on file  . Frequency of Social Gatherings with Friends and Family: Not on file  . Attends Religious Services: Not on file  . Active Member of Clubs or Organizations: Not on file  . Attends Banker Meetings: Not on file  . Marital Status: Not on file  Intimate Partner Violence:   . Fear of Current or Ex-Partner: Not on file  . Emotionally Abused: Not on file  . Physically Abused: Not on file  . Sexually Abused: Not on file    Review of Systems:    Constitutional: No weight loss, fever or chills Cardiovascular: No chest pain Respiratory: No SOB  Gastrointestinal: See HPI and otherwise negative   Physical Exam:  Vital signs: BP 140/82 (BP Location: Right Arm, Patient Position: Sitting, Cuff Size: Normal)   Constitutional:   Pleasant wheelchair-bound Caucasian male appears to be in NAD, Well developed, Well  nourished, alert and cooperative, spastic CP Respiratory: Respirations even and unlabored. Lungs clear to auscultation bilaterally.   No wheezes, crackles, or rhonchi.  Cardiovascular: Normal S1, S2. No MRG. Regular rate and rhythm. No peripheral edema, cyanosis or pallor.  Gastrointestinal:  Soft, nondistended, mild generalized ttp. No rebound or guarding. Normal bowel sounds. No appreciable masses or hepatomegaly. Rectal:  Not performed.  Psychiatric: Demonstrates good judgement and reason without abnormal affect or behaviors.  No recent labs or imaging.  Assessment: 1.  IBS-D: Flare of symptoms of generalized abdominal pain and loose stools about 3 weeks ago, now improved but dealing with constipation 2.  Lymphocytic colitis  Plan: 1.  At this time, recommend the patient discontinue his Imodium.  Discussed decreasing his Dicyclomine as well but he does not want to do this for fear that he will have a lot of abdominal pain. 2.  Recommend the patient start Viberzi 75 mg twice daily as prescribed by Dr. Leone Payor.  He can hold his Imodium to see how this works for him.  Discussed that he may actually be able to decrease more of his medicines including Dicyclomine if this works well. 3.  Recommend the patient follow-up with [place] via his MyChart to update [place] on how things are working. 4.  Patient to follow in clinic as needed in the future.  Hyacinth Meeker, PA-C [facility] 04/29/2019, 3:11 PM  Cc: Mattie Marlin, MD

## 2019-04-29 NOTE — Patient Instructions (Signed)
If you are age 45 or older, your body mass index should be between 23-30. Your There is no height or weight on file to calculate BMI. If this is out of the aforementioned range listed, please consider follow up with your Primary Care Provider.  If you are age 22 or younger, your body mass index should be between 19-25. Your There is no height or weight on file to calculate BMI. If this is out of the aformentioned range listed, please consider follow up with your Primary Care Provider.   Hold Imodium for now.  Start trial of Viberzi once having bowel movements again.

## 2019-05-20 ENCOUNTER — Ambulatory Visit: Payer: Medicaid Other | Admitting: Podiatry

## 2019-05-20 ENCOUNTER — Ambulatory Visit: Payer: Medicaid Other | Admitting: Internal Medicine

## 2019-05-26 ENCOUNTER — Other Ambulatory Visit: Payer: Self-pay

## 2019-05-26 ENCOUNTER — Ambulatory Visit: Payer: Medicaid Other | Admitting: Podiatry

## 2019-05-26 ENCOUNTER — Ambulatory Visit: Payer: Medicaid Other

## 2019-05-26 DIAGNOSIS — M659 Synovitis and tenosynovitis, unspecified: Secondary | ICD-10-CM

## 2019-05-26 DIAGNOSIS — M25572 Pain in left ankle and joints of left foot: Secondary | ICD-10-CM

## 2019-05-26 MED ORDER — MELOXICAM 15 MG PO TABS: 15.0000 mg | ORAL_TABLET | Freq: Every day | ORAL | 1 refills | Status: DC

## 2019-05-26 NOTE — Progress Notes (Signed)
   HPI: 44 y.o. male PMHx of cerebral palsy and quadriplegia presenting today for evaluation of left ankle pain.  Patient presents today with his caregiver.  Patient states that over the last 2 weeks he has had pain to the lateral aspect of his left ankle.  He denies trauma.  Gradual onset.  Patient states that he is a burning sensation to the lateral aspect of the ankle and it feels like pins-and-needles in the ankle bone.  Originally, the patient perhaps thought that he was associated with his neuropathy.  Patient states that he has been using a Coppertone ankle compression sleeve which helps.  Patient's lower extremities are spastic with involuntary movement.  He presents for further treatment evaluation  Past Medical History:  Diagnosis Date  . Cerebral palsy (HCC)   . CNS disorder   . Diarrhea   . Failure to thrive in childhood   . GERD (gastroesophageal reflux disease) 02/11/2013  . Lymphocytic colitis 12/21/2004  . Mixed spastic athetoid cerebral palsy (HCC)   . Muscle spasms of head and/or neck   . Severe flexion contractures of all joints   . Varicose vein      Physical Exam: General: The patient is alert and oriented x3 in no acute distress.  Dermatology: Skin is warm, dry and supple bilateral lower extremities. Negative for open lesions or macerations.  Vascular: Palpable pedal pulses bilaterally. No edema or erythema noted. Capillary refill within normal limits.  Neurological: Epicritic and protective threshold diminished bilaterally.   Musculoskeletal Exam: Quadriplegia bilateral lower extremities.  Spastic contracted joints of the foot and ankle bilateral lower extremities.  There is some tenderness palpation overlying the lateral aspect of the left ankle joint  Assessment: 1.  Capsulitis/synovitis left ankle, lateral aspect   Plan of Care:  1. Patient evaluated. 2. Conservative management recommended the moment.  Continue the compression ankle sleeve 3.  Prescription  for meloxicam 15 mg daily 4.  Return to clinic as needed.  If there is no improvement over the next few weeks we will attempt ankle joint injection      Felecia Shelling, DPM [facility]  Dr. Felecia Shelling, DPM    [address] [place], [place] [address]                Office [phone]  Fax [phone]

## 2019-06-23 ENCOUNTER — Other Ambulatory Visit: Payer: Self-pay | Admitting: Internal Medicine

## 2019-06-23 NOTE — Telephone Encounter (Signed)
How many refills Sir?  Thank you for your time. 

## 2019-07-22 ENCOUNTER — Other Ambulatory Visit: Payer: Self-pay | Admitting: Internal Medicine

## 2019-07-22 ENCOUNTER — Other Ambulatory Visit: Payer: Self-pay | Admitting: Podiatry

## 2019-07-22 NOTE — Telephone Encounter (Signed)
I spoke with Alan Benson his care giver and she said that the viberzi is helping him. We set up a 09/28/2019 appointment with Dr Leone Payor at 2:30pm. The rx has been faxed to [facility].

## 2019-07-22 NOTE — Telephone Encounter (Signed)
May I refill Sir? Thank you for your time. 

## 2019-07-22 NOTE — Telephone Encounter (Signed)
OK to refill for another 3 mos  I am assuming this is helping. Can you double check on that?  He should have an OV me or JLL in about 2 mos

## 2019-08-12 IMAGING — DX DG ABDOMEN 1V
1 series · 1 of 1 positions shown · non-contrast
Comparison: KUB 06/06/2012

CLINICAL DATA: Intermittent diarrhea and constipation, diffuse
abdominal pain for 2 and half weeks

EXAM:
ABDOMEN - 1 VIEW

[abdomen kub]
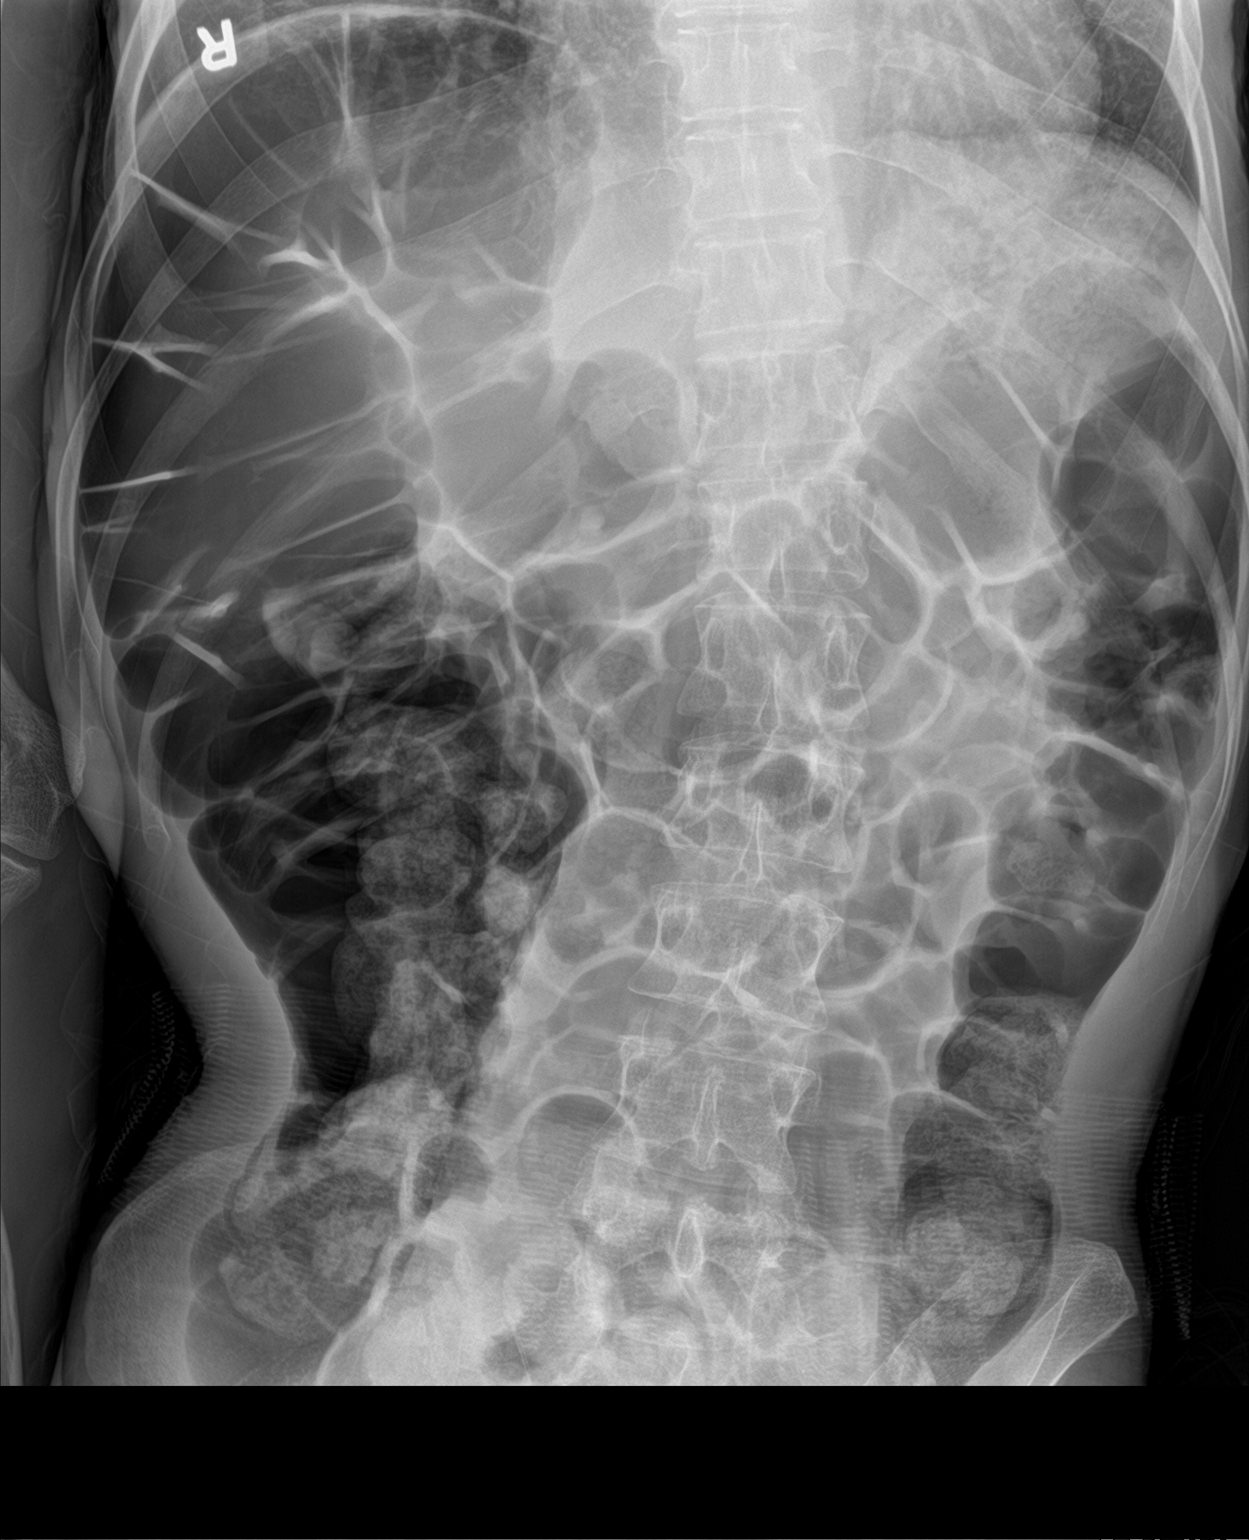

[1 of 1 positions shown; findings below may reference images not displayed]

FINDINGS: As on the prior KUB, there is large and small bowel gas present
diffusely most consistent with ileus and possible aerophagia. No
definite bowel obstruction is noted. There is some feces present
within the rectosigmoid colon. No definite opaque calculi are noted
although calcification in the right upper quadrant is suspected no
acute bony abnormality is seen.
IMPRESSION: 1. Gaseous distention of large and small bowel diffusely most
consistent with ileus, possibly due to aerophagia, relatively
unchanged compared to a KUB from 8191.
2. No definite bowel obstruction.
3. Question calcification in the right upper quadrant possibly
gallstone or right renal calculus.

## 2019-08-16 ENCOUNTER — Other Ambulatory Visit: Payer: Self-pay | Admitting: Podiatry

## 2019-08-19 ENCOUNTER — Other Ambulatory Visit: Payer: Self-pay | Admitting: Podiatry

## 2019-09-09 ENCOUNTER — Other Ambulatory Visit: Payer: Self-pay | Admitting: Podiatry

## 2019-09-28 ENCOUNTER — Ambulatory Visit: Payer: Medicaid Other | Admitting: Internal Medicine

## 2019-10-08 ENCOUNTER — Other Ambulatory Visit: Payer: Self-pay | Admitting: Podiatry

## 2019-10-08 ENCOUNTER — Other Ambulatory Visit: Payer: Self-pay | Admitting: Internal Medicine

## 2019-10-08 NOTE — Telephone Encounter (Signed)
Please advise Sir, thank you. 

## 2019-11-09 ENCOUNTER — Encounter: Payer: Self-pay | Admitting: Podiatry

## 2019-11-15 ENCOUNTER — Ambulatory Visit (INDEPENDENT_AMBULATORY_CARE_PROVIDER_SITE_OTHER): Payer: Medicaid Other | Admitting: Internal Medicine

## 2019-11-15 ENCOUNTER — Encounter: Payer: Self-pay | Admitting: Internal Medicine

## 2019-11-15 VITALS — BP 110/81 | HR 78

## 2019-11-15 DIAGNOSIS — K580 Irritable bowel syndrome with diarrhea: Secondary | ICD-10-CM | POA: Insufficient documentation

## 2019-11-15 DIAGNOSIS — K52832 Lymphocytic colitis: Secondary | ICD-10-CM | POA: Diagnosis not present

## 2019-11-15 NOTE — Progress Notes (Signed)
Alan Benson 43 y.o. [birth year] [id number]  Assessment & Plan:   Encounter Diagnoses  Name Primary?  . Lymphocytic colitis Yes  . Irritable bowel syndrome with diarrhea     Doing well on current regimen.  We are covering IBS and lymphocytic colitis.  He is very satisfied with quality of life so I will not change treatment at this time.  Return to clinic in about 6 months.  Okay to refill meds in between.  I did educate him about the potential side effects of pancreatitis with Viberzi.  I appreciate the opportunity to care for this patient. CC: Mattie Marlin, MD   Subjective:   Chief Complaint: Follow-up of lymphocytic colitis and diarrhea problems  HPI Alan Benson is here with his assistant, reporting that his current regimen of medication is working quite well to control his diarrhea and multiple bowel movement issues.  He has a history of lymphocytic colitis treated with budesonide and mesalamine.  He also uses loperamide and at last visit with Hyacinth Meeker in January Viberzi was added which has made a big difference.  He reports he is "not going all the time".  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  He is hoping to work again but [employer] pandemic has prevented that.   Allergies  Allergen Reactions  . Penicillins     REACTION: Hives   Current Meds  Medication Sig  . baclofen (LIORESAL) 20 MG tablet TAKE 1 TABLET BY MOUTH FOUR TIMES A DAY  . budesonide (ENTOCORT EC) 3 MG 24 hr capsule TAKE 1 CAPSULE BY MOUTH THREE TIMES A DAY  . chlorhexidine (PERIDEX) 0.12 % solution RINSE & SPIT 15 MLS BY MOUTH EVERY IN THE MORNING  . diazepam (VALIUM) 2 MG tablet TAKE 1 TABLET BY MOUTH EVERY TWELVE HOURS AS NEEDED FOR ANXIETY  . diazepam (VALIUM) 5 MG tablet Take 5 mg by mouth at bedtime.  . dicyclomine (BENTYL) 20 MG tablet TAKE 1 TABLET BY MOUTH THREE TIMES A DAY BEFORE MEALS  . Folic Acid 0.8 MG CAPS Take 2 capsules by mouth daily.   Marland Kitchen loperamide (IMODIUM) 2 MG  capsule TAKE 1 CAPSULE BY MOUTH FOUR TIMES A DAY BEFORE MEALS & AT BEDTIME  . meloxicam (MOBIC) 15 MG tablet TAKE 1 TABLET BY MOUTH DAILY *NEEDS APPT*  . mesalamine (APRISO) 0.375 g 24 hr capsule TAKE 4 CAPSULES (1.5 G TOTAL) BY MOUTH DAILY.  . Multiple Vitamin (MULTI-VITAMINS) TABS TAKE 1 TABLET BY MOUTH DAILY  . Multiple Vitamins-Iron (MULTIVITAMINS WITH IRON) TABS tablet Take by mouth.  . ondansetron (ZOFRAN) 8 MG tablet TAKE 1 TABLET BY MOUTH EVERY MORNING AS NEEDED  . VIBERZI 75 MG TABS TAKE 75 MG BY MOUTH 2 (TWO) TIMES DAILY.   Past Medical History:  Diagnosis Date  . Cerebral palsy (HCC)   . CNS disorder   . Diarrhea   . Failure to thrive in childhood   . GERD (gastroesophageal reflux disease) 02/11/2013  . Lymphocytic colitis 12/21/2004  . Mixed spastic athetoid cerebral palsy (HCC)   . Muscle spasms of head and/or neck   . Severe flexion contractures of all joints   . Varicose vein    Past Surgical History:  Procedure Laterality Date  . COLONOSCOPY  2006  . COLONOSCOPY WITH PROPOFOL N/A 08/12/2012   Procedure: COLONOSCOPY WITH PROPOFOL;  Surgeon: Iva Boop, MD;  Location: [facility];  Service: Endoscopy;  Laterality: N/A;  . ESOPHAGOGASTRODUODENOSCOPY (EGD) WITH PROPOFOL N/A 08/12/2012  Procedure: ESOPHAGOGASTRODUODENOSCOPY (EGD) WITH PROPOFOL;  Surgeon: Iva Boop, MD;  Location: [facility];  Service: Endoscopy;  Laterality: N/A;   Social History   Social History Narrative   Single, disabled, lives in an apartment with help, full-time assistance.   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   Review of Systems   Objective:   Physical Exam BP 110/81   Pulse 78  No acute distress Spastic quadriplegia/CP He is alert and oriented x3

## 2019-11-15 NOTE — Patient Instructions (Signed)
It was great to see you today.  Please follow up with Dr Leone Payor in February 2022.  Call in December to set up that appointment.   Call for medicine refills you need.   I appreciate the opportunity to care for you. Stan Head, MD, [facility]

## 2020-05-12 ENCOUNTER — Telehealth: Payer: Self-pay | Admitting: Internal Medicine

## 2020-05-12 ENCOUNTER — Telehealth: Payer: Self-pay | Admitting: *Deleted

## 2020-05-12 MED ORDER — VIBERZI 75 MG PO TABS: 75.0000 mg | ORAL_TABLET | Freq: Two times a day (BID) | ORAL | 2 refills | Status: DC

## 2020-05-12 NOTE — Telephone Encounter (Signed)
Refill for 2 or 3 months and have him make a follow-up appointment

## 2020-05-12 NOTE — Telephone Encounter (Signed)
Please advise Sir, thank you. 

## 2020-05-12 NOTE — Telephone Encounter (Signed)
Alan Benson w/ [facility]([phone])  is requesting an approval for refill on Meloxicam-15mg  tablets. Please advise.

## 2020-05-12 NOTE — Telephone Encounter (Signed)
Pharmacy requesting refill on Viberzi

## 2020-05-12 NOTE — Telephone Encounter (Signed)
Viberzi refilled as requested. Note attached to call for appointment.

## 2020-05-15 NOTE — Telephone Encounter (Signed)
I spoke with Amy at [facility], she said that patient has not had Meloxicam since 11/08/19 and she requested to DC the medication.  Per Dr. Logan Bores verbal order, ok to DC.  I informed her that if he requested another refill, he would need office visit first since it has been 1 year since last seen.  She verbalized understanding

## 2020-07-04 ENCOUNTER — Ambulatory Visit: Payer: Medicaid Other | Admitting: Internal Medicine

## 2020-07-04 ENCOUNTER — Encounter: Payer: Self-pay | Admitting: Internal Medicine

## 2020-07-04 VITALS — BP 120/70 | HR 64

## 2020-07-04 DIAGNOSIS — K52832 Lymphocytic colitis: Secondary | ICD-10-CM

## 2020-07-04 DIAGNOSIS — K580 Irritable bowel syndrome with diarrhea: Secondary | ICD-10-CM | POA: Diagnosis not present

## 2020-07-04 MED ORDER — BUDESONIDE 3 MG PO CPEP: 9.0000 mg | ORAL_CAPSULE | ORAL | 1 refills | Status: DC

## 2020-07-04 NOTE — Progress Notes (Signed)
Alan Benson 44 y.o. 28-May-1975 [id number]  Assessment & Plan:   Encounter Diagnoses  Name Primary?  . Lymphocytic colitis Yes  . Irritable bowel syndrome with diarrhea     As best we can tell Alan Benson was off of his budesonide.  We will reinstitute that and hopefully it will help his loose stools that have been occurring.  He will let me know if that is not the case.  Otherwise return in 6 months.  Continue other medications.   Subjective:   Chief Complaint: Diarrhea in the setting of lymphocytic colitis  HPI Alan Benson is a 45 year old white man with spastic cerebral palsy here with his caretaker, with complaints of looser stools than usual.  For the past month he has had problems with more voluminous and looser stools that cause soiling in a clean up issue.  He is not sure why this is happening.  We reviewed his medications and he is not on budesonide anymore.  He had previously been maintained on budesonide 9 mg daily, Apriso and loperamide 1 tablet 4 times a day.   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  He has a lab left to take and I think that has delayed things. Allergies  Allergen Reactions  . Penicillins     REACTION: Hives   Current Meds  Medication Sig  . baclofen (LIORESAL) 20 MG tablet TAKE 1 TABLET BY MOUTH FOUR TIMES A DAY  . chlorhexidine (PERIDEX) 0.12 % solution RINSE & SPIT 15 MLS BY MOUTH EVERY IN THE MORNING  . diazepam (VALIUM) 2 MG tablet TAKE 1 TABLET BY MOUTH EVERY TWELVE HOURS AS NEEDED FOR ANXIETY  . Eluxadoline (VIBERZI) 75 MG TABS Take 75 mg by mouth 2 (two) times daily.  . folic acid (FOLVITE) 1 MG tablet Take 1 mg by mouth daily.  Marland Kitchen loperamide (IMODIUM) 2 MG capsule TAKE 1 CAPSULE BY MOUTH FOUR TIMES A DAY BEFORE MEALS & AT BEDTIME  . mesalamine (APRISO) 0.375 g 24 hr capsule TAKE 4 CAPSULES (1.5 G TOTAL) BY MOUTH DAILY.  . Multiple Vitamin (MULTI-VITAMINS) TABS TAKE 1 TABLET BY MOUTH DAILY  . risperiDONE  (RISPERDAL M-TABS) 0.5 MG disintegrating tablet Take 0.5 mg by mouth at bedtime.  . Vitamins A & D (VITAMIN A & D) OINT Apply 1 application topically in the morning and at bedtime.   Past Medical History:  Diagnosis Date  . Cerebral palsy (HCC)   . CNS disorder   . Diarrhea   . Failure to thrive in childhood   . GERD (gastroesophageal reflux disease) 02/11/2013  . Lymphocytic colitis 12/21/2004  . Mixed spastic athetoid cerebral palsy (HCC)   . Muscle spasms of head and/or neck   . Severe flexion contractures of all joints   . Varicose vein    Past Surgical History:  Procedure Laterality Date  . COLONOSCOPY  2006  . COLONOSCOPY WITH PROPOFOL N/A 08/12/2012   Procedure: COLONOSCOPY WITH PROPOFOL;  Surgeon: Iva Boop, MD;  Location: [facility];  Service: Endoscopy;  Laterality: N/A;  . ESOPHAGOGASTRODUODENOSCOPY (EGD) WITH PROPOFOL N/A 08/12/2012   Procedure: ESOPHAGOGASTRODUODENOSCOPY (EGD) WITH PROPOFOL;  Surgeon: Iva Boop, MD;  Location: [facility];  Service: Endoscopy;  Laterality: N/A;   Social History   Social History Narrative   Single, disabled, lives in an apartment with help, full-time assistance.   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   Review of Systems As per HPI  Objective:   Physical Exam BP 120/70 (BP Location: Left Arm, Patient Position: Sitting, Cuff Size: Normal)   Pulse 49  Middle-aged white man with cerebral palsy spasticity.

## 2020-07-04 NOTE — Patient Instructions (Signed)
We have sent the following medications to your pharmacy for you to pick up at your convenience: Budesonide   Follow up with Dr Leone Payor in 6 months or sooner if needed.  Great to see you today Guilherme Schwenke luck with school.   I appreciate the opportunity to care for you. Stan Head, MD, [facility]

## 2020-07-07 ENCOUNTER — Other Ambulatory Visit: Payer: Self-pay

## 2020-07-07 MED ORDER — MESALAMINE ER 0.375 G PO CP24: ORAL_CAPSULE | ORAL | 11 refills | Status: DC

## 2020-07-25 ENCOUNTER — Other Ambulatory Visit: Payer: Self-pay | Admitting: Internal Medicine

## 2020-10-02 ENCOUNTER — Telehealth: Payer: Self-pay

## 2020-10-02 NOTE — Telephone Encounter (Signed)
Appointment scheduled for Friday, 9-9 at 11:00 am. Alan Benson [phone] but number is not in service.

## 2020-10-02 NOTE — Telephone Encounter (Signed)
-----   Message from Patti E [place], [place] sent at 07/04/2020  1:19 PM EDT ----- Needs a Sept appointment to come see  Dr Shelle Iron, call [place] at [phone]

## 2020-10-02 NOTE — Telephone Encounter (Signed)
Please advise 

## 2020-10-03 ENCOUNTER — Other Ambulatory Visit: Payer: Self-pay | Admitting: Internal Medicine

## 2020-10-03 NOTE — Telephone Encounter (Signed)
I called the [name] and left detailed message that we need updated #'s please. I called his work # and they said no Candice There so I removed that #. We will await a call back.

## 2020-10-06 NOTE — Telephone Encounter (Signed)
I left a message on the home # to please call us Monday to set up an appointment in September for [facility]. It was a male voice , perhaps she is the caregiver, her voicemail didn't give a name.

## 2020-10-06 NOTE — Telephone Encounter (Signed)
Appointment for 12/22/20 was made by mistake with Dr Adela Lank. We will continue to try and reach him.

## 2020-10-19 NOTE — Telephone Encounter (Signed)
I was able to reach Deatra James the residential provider for [place]. We booked him an appointment with Dr Leone Payor for 12/25/20 at 10:30am.

## 2020-11-06 ENCOUNTER — Telehealth: Payer: Self-pay | Admitting: Internal Medicine

## 2020-11-06 MED ORDER — LOPERAMIDE HCL 2 MG PO CAPS: 2.0000 mg | ORAL_CAPSULE | ORAL | 1 refills | Status: DC | PRN

## 2020-11-06 NOTE — Telephone Encounter (Signed)
Inbound call from [facility] requesting a call back asking if the pt needs to get a refill on Loperamide or does the medication need to be discontinued.

## 2020-11-06 NOTE — Telephone Encounter (Signed)
Called pharmacy and told them they could refill the patients imodium , they dispense his pacts where he lives in a facility. Changed the Imodium to as needed

## 2020-11-15 ENCOUNTER — Other Ambulatory Visit: Payer: Self-pay | Admitting: Internal Medicine

## 2020-11-15 NOTE — Telephone Encounter (Signed)
Can you refill this for Uzair Godley, thank you.

## 2020-11-16 NOTE — Telephone Encounter (Signed)
Rx sent 

## 2020-12-15 ENCOUNTER — Other Ambulatory Visit: Payer: Self-pay | Admitting: Internal Medicine

## 2020-12-22 ENCOUNTER — Ambulatory Visit: Payer: Medicaid Other | Admitting: Gastroenterology

## 2020-12-25 ENCOUNTER — Ambulatory Visit (INDEPENDENT_AMBULATORY_CARE_PROVIDER_SITE_OTHER): Payer: Medicaid Other | Admitting: Internal Medicine

## 2020-12-25 ENCOUNTER — Encounter: Payer: Self-pay | Admitting: Internal Medicine

## 2020-12-25 VITALS — BP 130/78 | HR 76

## 2020-12-25 DIAGNOSIS — K52832 Lymphocytic colitis: Secondary | ICD-10-CM

## 2020-12-25 DIAGNOSIS — K580 Irritable bowel syndrome with diarrhea: Secondary | ICD-10-CM | POA: Diagnosis not present

## 2020-12-25 NOTE — Progress Notes (Signed)
Alan Benson 44 y.o. May 14, 1975 [id number]  Assessment & Plan:   Encounter Diagnoses  Name Primary?   Lymphocytic colitis Yes   Irritable bowel syndrome with diarrhea     Continue current meds  See me again in 6 mos, sooner prn  Cc:Alan Marlin, MD    Subjective:   Chief Complaint: f/u colitis and IBS  HPI 45 year old white man with spastic cerebral palsy and chronic lymphocytic colitis/IBS issues treated with budesonide, mesalamine and Viberzi and Imodium chronically.  Was last seen in March of this year and it sounds like he was off his budesonide for a while though he is back on that now.Pleased w/ stool #, frequency and control.  Here with caregiver.  Still pursuing AA degree in Orthoptist.   Allergies  Allergen Reactions   Penicillins     REACTION: Hives   Current Meds  Medication Sig   baclofen (LIORESAL) 20 MG tablet TAKE 1 TABLET BY MOUTH FOUR TIMES A DAY   budesonide (ENTOCORT EC) 3 MG 24 hr capsule TAKE 3 CAPSULES (=TO 9MG ) BY MOUTH EVERY MORNING.   chlorhexidine (PERIDEX) 0.12 % solution RINSE & SPIT 15 MLS BY MOUTH EVERY IN THE MORNING   diazepam (VALIUM) 2 MG tablet TAKE 1 TABLET BY MOUTH EVERY TWELVE HOURS AS NEEDED FOR ANXIETY   Eluxadoline (VIBERZI) 75 MG TABS TAKE 1 TABLET BY MOUTH TWICE DAILY.   folic acid (FOLVITE) 1 MG tablet Take 1 mg by mouth daily.   loperamide (IMODIUM) 2 MG capsule Take 1 capsule (2 mg total) by mouth as needed for diarrhea or loose stools.   mesalamine (APRISO) 0.375 g 24 hr capsule TAKE 4 CAPSULES (1.5 G TOTAL) BY MOUTH DAILY.   Multiple Vitamin (MULTI-VITAMINS) TABS TAKE 1 TABLET BY MOUTH DAILY   ondansetron (ZOFRAN) 8 MG tablet TAKE 1 TABLET BY MOUTH EVERY MORNING AS NEEDED   risperiDONE (RISPERDAL M-TABS) 0.5 MG disintegrating tablet Take 0.5 mg by mouth at bedtime.   Vitamins A & D (VITAMIN A & D) OINT Apply 1 application topically in the morning and at bedtime.   Past Medical History:  Diagnosis Date    Cerebral palsy (HCC)    CNS disorder    Diarrhea    Failure to thrive in childhood    GERD (gastroesophageal reflux disease) 02/11/2013   Lymphocytic colitis 12/21/2004   Mixed spastic athetoid cerebral palsy (HCC)    Muscle spasms of head and/or neck    Severe flexion contractures of all joints    Varicose vein    Past Surgical History:  Procedure Laterality Date   COLONOSCOPY  2006   COLONOSCOPY WITH PROPOFOL N/A 08/12/2012   Procedure: COLONOSCOPY WITH PROPOFOL;  Surgeon: 08/14/2012, MD;  Location: [facility];  Service: Endoscopy;  Laterality: N/A;   ESOPHAGOGASTRODUODENOSCOPY (EGD) WITH PROPOFOL N/A 08/12/2012   Procedure: ESOPHAGOGASTRODUODENOSCOPY (EGD) WITH PROPOFOL;  Surgeon: 08/14/2012, MD;  Location: [facility];  Service: Endoscopy;  Laterality: N/A;   Social History   Social History Narrative   Single, disabled, lives in an apartment with help, full-time assistance.   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   Review of Systems As per HPI  Objective:   Physical Exam BP 130/78 (BP Location: Left Arm, Patient Position: Sitting, Cuff Size: Normal)   Pulse 76  Middle aged wm w/ severe spastic CP spasticity Abd  soft NT

## 2020-12-25 NOTE — Patient Instructions (Signed)
Great to see you today!  Glad your doing well.  Please call us in 3 months for a appointment in March of 2023.   I appreciate the opportunity to care for you. Stan Head, MD, [facility]

## 2021-01-31 ENCOUNTER — Other Ambulatory Visit: Payer: Self-pay | Admitting: Internal Medicine

## 2021-01-31 MED ORDER — LOPERAMIDE HCL 2 MG PO CAPS: ORAL_CAPSULE | ORAL | 3 refills | Status: DC

## 2021-02-19 ENCOUNTER — Other Ambulatory Visit: Payer: Self-pay | Admitting: Internal Medicine

## 2021-05-03 ENCOUNTER — Other Ambulatory Visit: Payer: Self-pay | Admitting: Internal Medicine

## 2021-05-23 ENCOUNTER — Other Ambulatory Visit: Payer: Self-pay | Admitting: Internal Medicine

## 2021-06-18 ENCOUNTER — Other Ambulatory Visit: Payer: Self-pay | Admitting: Internal Medicine

## 2021-08-20 ENCOUNTER — Other Ambulatory Visit: Payer: Self-pay | Admitting: Internal Medicine

## 2021-08-20 NOTE — Telephone Encounter (Signed)
Please advise Sir, thank you. 

## 2021-10-18 ENCOUNTER — Other Ambulatory Visit: Payer: Self-pay | Admitting: Internal Medicine

## 2021-11-20 ENCOUNTER — Other Ambulatory Visit: Payer: Self-pay | Admitting: Internal Medicine

## 2022-01-16 ENCOUNTER — Other Ambulatory Visit: Payer: Self-pay | Admitting: Internal Medicine

## 2022-01-30 ENCOUNTER — Encounter: Payer: Self-pay | Admitting: Physician Assistant

## 2022-01-30 ENCOUNTER — Telehealth (INDEPENDENT_AMBULATORY_CARE_PROVIDER_SITE_OTHER): Payer: Medicaid Other | Admitting: Physician Assistant

## 2022-01-30 VITALS — BP 118/80 | HR 64

## 2022-01-30 DIAGNOSIS — K5903 Drug induced constipation: Secondary | ICD-10-CM | POA: Diagnosis not present

## 2022-01-30 DIAGNOSIS — K52832 Lymphocytic colitis: Secondary | ICD-10-CM | POA: Diagnosis not present

## 2022-01-30 DIAGNOSIS — K580 Irritable bowel syndrome with diarrhea: Secondary | ICD-10-CM | POA: Diagnosis not present

## 2022-01-30 NOTE — Progress Notes (Signed)
Chief Complaint: "Frequent urges to have a bowel movement throughout the day"  HPI:    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    08/12/2012 colonoscopy with lymphocytic colitis.  Repeat recommended in 10 years.    04/29/2019 patient saw me in clinic and at that time discussed he was on Loperamide 2 mg 4 times a day, Apriso 4 capsules by mouth daily and Budesonide 3 mg 3 times daily and Dicyclomine 20 mg half tab every morning.  He then had Viberzi added.    12/25/2020 patient seen in clinic by Dr. Leone Payor and at that time he had last been seen in March and was off of his Budesonide for a while but was back on it now, please with stool number frequency and control at that time.  He was continued on his medications and told to follow-up in 3 months.    01/31/2021 patient was having trouble with loose bowel movements.  He was asking to try Loperamide as a scheduled med along with Viberzi.  That time prescription sent in for 2 mg Loperamide twice a day plus as needed up to 6 total a day.    Today, patient explains that he is actually having a problem with feeling incomplete evacuation and having only very small formed bowel movements over the past month.  He is still on all of his medications including Viberzi, Budesonide, Imodium BID and Apriso 4 capsules daily.      Denies wt loss or fever.  Past Medical History:  Diagnosis Date   Cerebral palsy (HCC)    CNS disorder    Diarrhea    Failure to thrive in childhood    GERD (gastroesophageal reflux disease) 02/11/2013   Lymphocytic colitis 12/21/2004   Mixed spastic athetoid cerebral palsy (HCC)    Muscle spasms of head and/or neck    Severe flexion contractures of all joints    Varicose vein     Past  Surgical History:  Procedure Laterality Date   COLONOSCOPY  2006   COLONOSCOPY WITH PROPOFOL N/A 08/12/2012   Procedure: COLONOSCOPY WITH PROPOFOL;  Surgeon: Iva Boop, MD;  Location: [facility];  Service: Endoscopy;  Laterality: N/A;   ESOPHAGOGASTRODUODENOSCOPY (EGD) WITH PROPOFOL N/A 08/12/2012   Procedure: ESOPHAGOGASTRODUODENOSCOPY (EGD) WITH PROPOFOL;  Surgeon: Iva Boop, MD;  Location: [facility];  Service: Endoscopy;  Laterality: N/A;    Current Outpatient Medications  Medication Sig Dispense Refill   baclofen (LIORESAL) 20 MG tablet TAKE 1 TABLET BY MOUTH FOUR TIMES A DAY 120 tablet 0   budesonide (ENTOCORT EC) 3 MG 24 hr capsule TAKE 3 CAPSULES (=TO 9MG ) BY MOUTH EVERY MORNING. 90 capsule 1   chlorhexidine (PERIDEX) 0.12 % solution RINSE & SPIT 15 MLS BY MOUTH EVERY IN THE MORNING 473 mL 11   diazepam (VALIUM) 2 MG tablet TAKE 1 TABLET BY MOUTH EVERY TWELVE HOURS AS NEEDED FOR ANXIETY 60 tablet 2   folic acid (FOLVITE) 1 MG tablet Take 1 mg by mouth daily.     loperamide (IMODIUM) 2 MG capsule TAKE 1 CAPSULE BY MOUTH TWICE DAILY. TAKE 1 CAPSULE BY MOUTH AS NEEDED UP TO 4 TIMES A DAY. 60 capsule 1   mesalamine (APRISO) 0.375 g 24 hr capsule TAKE 4 CAPSULES BY MOUTH DAILY. 120  capsule 1   Multiple Vitamin (MULTI-VITAMINS) TABS TAKE 1 TABLET BY MOUTH DAILY 90 tablet 0   ondansetron (ZOFRAN) 8 MG tablet TAKE 1 TABLET BY MOUTH EVERY MORNING AS NEEDED 30 tablet 11   risperiDONE (RISPERDAL M-TABS) 0.5 MG disintegrating tablet Take 0.5 mg by mouth at bedtime.     VIBERZI 75 MG TABS TAKE 1 TABLET BY MOUTH TWICE DAILY. 60 tablet 1   Vitamins A & D (VITAMIN A & D) OINT Apply 1 application topically in the morning and at bedtime.     No current facility-administered medications for this visit.    Allergies as of 01/30/2022 - Review Complete 12/25/2020  Allergen Reaction Noted   Penicillins  02/16/2007    No family history on file.  Social History   Socioeconomic History    Marital status: Single    Spouse name: Not on file   Number of children: 0   Years of education: Not on file   Highest education level: Not on file  Occupational History   Occupation: Disabled  Tobacco Use   Smoking status: Never   Smokeless tobacco: Never  Vaping Use   Vaping Use: Never used  Substance and Sexual Activity   Alcohol use: No   Drug use: No   Sexual activity: Never  Other Topics Concern   Not on file  Social History Narrative   Single, disabled, lives in an apartment with help, full-time assistance.   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   No alcohol tobacco or drug use   Social Determinants of Radio broadcast assistant Strain: Not on file  Food Insecurity: Not on file  Transportation Needs: Not on file  Physical Activity: Not on file  Stress: Not on file  Social Connections: Not on file  Intimate Partner Violence: Not on file    Review of Systems:    Constitutional: No weight loss, fever or chills Cardiovascular: No chest pain, chest pressure or palpitations   Respiratory: No SOB  Gastrointestinal: See HPI and otherwise negative   Physical Exam:  Vital signs: BP 118/80   Pulse 64   +in wheelchair  Constitutional:   Pleasant Caucasian male appears to be in NAD, alert and cooperative + severe spastic CP Respiratory: Respirations even and unlabored. Lungs clear to auscultation bilaterally.   No wheezes, crackles, or rhonchi.  Cardiovascular: Normal S1, S2. No MRG. Regular rate and rhythm. No peripheral edema, cyanosis or pallor.  Gastrointestinal:  Soft, non distended, normal BS all four quadrants Rectal:  Not performed.  Psychiatric: Demonstrates good judgement and reason without abnormal affect or behaviors.  No recent labs or imaging.  Assessment: 1.  IBS-D 2.  Lymphocytic colitis 3. Constipation: Likely due to assortment of meds for diarrhea  Plan: 1. At this point would recommend holding Loperamide to see if this  helps with constipation.  If this does not help over the next week then we will need to discuss possibly decreasing Loperamide, Viberzi or Apriso in the future. 2.  Discussed he can certainly go back on Loperamide if he starts back with diarrhea.  Maybe just qd until we see how that works with slow titration as needed 3. Patient will let us know how he is doing over the next few weeks and can return to clinic as needed with Dr. Carlean Purl or myself  Ellouise Newer, PA-C [facility] 01/30/2022, 10:37 AM  Cc: Arlyss Repress, MD

## 2022-01-30 NOTE — Patient Instructions (Signed)
Stop Imodium for now.   _______________________________________________________  If you are age 47 or older, your body mass index should be between 23-30. Your There is no height or weight on file to calculate BMI. If this is out of the aforementioned range listed, please consider follow up with your Primary Care Provider.  If you are age 44 or younger, your body mass index should be between 19-25. Your There is no height or weight on file to calculate BMI. If this is out of the aformentioned range listed, please consider follow up with your Primary Care Provider.   ________________________________________________________  The [facility] providers would like to encourage you to use [facility] to communicate with providers for non-urgent requests or questions.  Due to long hold times on the telephone, sending your provider a message by [facility] may be a faster and more efficient way to get a response.  Please allow 48 business hours for a response.  Please remember that this is for non-urgent requests.  _______________________________________________________

## 2022-02-19 ENCOUNTER — Other Ambulatory Visit (HOSPITAL_COMMUNITY): Payer: Self-pay

## 2022-02-19 ENCOUNTER — Telehealth: Payer: Self-pay

## 2022-02-19 NOTE — Telephone Encounter (Signed)
Pharmacy Patient Advocate Encounter  Prior Authorization for Viberzi 75mg  has been approved.    PA# [id number] Effective dates: 02/19/2022 through 02/19/2023   Spoke with Pharmacy to process.   Patient Advocate Encounter   Received notification from Medicaid that prior authorization for Viberzi 75mg  is required.   PA submitted on 02/19/2022 Status is pending       Joneen Boers, [place] Patient Advocate Specialist [place] Patient Advocate Team Direct Number: [phone] Fax: [phone]

## 2022-04-10 ENCOUNTER — Other Ambulatory Visit: Payer: Self-pay | Admitting: Internal Medicine

## 2022-04-11 ENCOUNTER — Telehealth: Payer: Self-pay | Admitting: Pharmacy Technician

## 2022-04-11 ENCOUNTER — Other Ambulatory Visit (HOSPITAL_COMMUNITY): Payer: Self-pay

## 2022-04-11 NOTE — Telephone Encounter (Signed)
Patient Advocate Encounter  Prior Authorization for MESALAMINE ER 0.375GM has been approved.    PA# [id number]  Effective dates: 12.28.23 through 12.27.24   Received notification from [facility] MEDICAID that prior authorization for MESALAMINE ER 0.375GM is required.   PA submitted on 12.28.23 VIA [place] TRACKS CONFIRMATION#  [id number] W Status is pending

## 2022-05-20 ENCOUNTER — Other Ambulatory Visit: Payer: Self-pay | Admitting: Internal Medicine

## 2022-06-14 ENCOUNTER — Other Ambulatory Visit: Payer: Self-pay | Admitting: Internal Medicine

## 2022-07-26 ENCOUNTER — Other Ambulatory Visit: Payer: Self-pay

## 2022-07-26 MED ORDER — BUDESONIDE 3 MG PO CPEP: ORAL_CAPSULE | ORAL | 5 refills | Status: DC

## 2022-07-26 MED ORDER — MESALAMINE ER 0.375 G PO CP24: 1.5000 g | ORAL_CAPSULE | Freq: Every day | ORAL | 5 refills | Status: DC

## 2022-07-26 NOTE — Telephone Encounter (Signed)
Mesalamine and budesonide refills okayed per Dr Leone Payor. Faxed the paper request to [facility] at fax # [phone].

## 2022-10-25 ENCOUNTER — Other Ambulatory Visit: Payer: Self-pay | Admitting: Internal Medicine

## 2022-11-13 ENCOUNTER — Telehealth: Payer: Self-pay | Admitting: Physician Assistant

## 2022-11-13 NOTE — Telephone Encounter (Signed)
Inbound call from [facility] requesting a new prescription for Viberzi be sent to pharmacy.  Fax: [phone]   Please advise.

## 2022-11-14 MED ORDER — VIBERZI 75 MG PO TABS: 1.0000 | ORAL_TABLET | Freq: Two times a day (BID) | ORAL | 5 refills | Status: DC

## 2022-11-14 NOTE — Telephone Encounter (Signed)
I spoke with Alan Benson and he said the [facility] in [place] has closed. He requested we send Alan Benson's Viberzi to [facility] in [place]. It was faxed to [phone].

## 2023-01-09 ENCOUNTER — Telehealth: Payer: Self-pay | Admitting: Internal Medicine

## 2023-01-09 NOTE — Telephone Encounter (Signed)
I have called and left Alan Benson a [employer] that we will forward this to our prior authorization team.

## 2023-01-09 NOTE — Telephone Encounter (Signed)
Inbound call from patient states he needs a PA for medication.   Vibrerzi and lioresal  Please advise. Thank you

## 2023-01-10 ENCOUNTER — Other Ambulatory Visit (HOSPITAL_COMMUNITY): Payer: Self-pay

## 2023-01-10 NOTE — Telephone Encounter (Signed)
Pt insurance has been updated. PA request are pending questions to populate.

## 2023-01-14 ENCOUNTER — Telehealth: Payer: Self-pay | Admitting: Pharmacy Technician

## 2023-01-14 NOTE — Telephone Encounter (Signed)
PA request has been Submitted. New Encounter created for follow up. For additional info see Pharmacy Prior Auth telephone encounter from 01-14-2023.

## 2023-01-14 NOTE — Telephone Encounter (Signed)
Pharmacy Patient Advocate Encounter   Received notification from Patient Advice Request messages that prior authorization for MESALAMINE ER 0.375MG  is required/requested.   Insurance verification completed.   The patient is insured through [facility] .   Per test claim: PA required; PA submitted to [facility] Medicaid via CoverMyMeds Key/confirmation #/EOC [id number] Status is pending

## 2023-01-14 NOTE — Telephone Encounter (Signed)
PA has been submitted for both Apriso and Viberzi to new insurance

## 2023-01-14 NOTE — Telephone Encounter (Signed)
Pharmacy Patient Advocate Encounter   Received notification from Patient Advice Request messages that prior authorization for VIBERZI 75MG  is required/requested.   Insurance verification completed.   The patient is insured through [facility] .   Per test claim: PA required; PA submitted to [employer] Medicaid via CoverMyMeds Key/confirmation #/EOC [id number] Status is pending

## 2023-01-21 ENCOUNTER — Other Ambulatory Visit (HOSPITAL_COMMUNITY): Payer: Self-pay

## 2023-01-21 NOTE — Telephone Encounter (Signed)
Pharmacy Patient Advocate Encounter  Received notification from [facility] that Prior Authorization for VIBERZI 75MG  has been APPROVED from 10.1.24 to 10.1.25   PA #/Case ID/Reference #:   Filled at pharmacy on 10.2.24

## 2023-01-23 ENCOUNTER — Other Ambulatory Visit: Payer: Self-pay | Admitting: Internal Medicine

## 2023-01-23 NOTE — Telephone Encounter (Addendum)
Pharmacy Patient Advocate Encounter  Received notification from [facility] that Prior Authorization for MESALAMINE ER 0.375MG  has been DENIED.  Full denial letter will be uploaded to the media tab. See denial reason below.   PA #/Case ID/Reference #: [id number]  Appeal has been submitted. Will advise when response is received or follow up in 1 week. Please be advised that most companies may take 30 days to make a decision. FAX: [phone]  Patient has tried Apriso and [employer]

## 2023-02-19 ENCOUNTER — Other Ambulatory Visit: Payer: Self-pay | Admitting: Internal Medicine

## 2023-04-02 NOTE — Telephone Encounter (Signed)
Did we hear back from the appeal?

## 2023-04-10 ENCOUNTER — Other Ambulatory Visit (HOSPITAL_COMMUNITY): Payer: Self-pay

## 2023-04-10 NOTE — Telephone Encounter (Signed)
Pharmacy Patient Advocate Encounter   Received notification from Pt Calls Messages that prior authorization for Mesalamine ER 0.375GM er capsules  is required/requested.   Insurance verification completed.   The patient is insured through [place] .   Per test claim: The current 30 day co-pay is, $0.00.  No PA needed at this time. This test claim was processed through [facility]- copay amounts may vary at other pharmacies due to pharmacy/plan contracts, or as the patient moves through the different stages of their insurance plan.

## 2023-04-10 NOTE — Telephone Encounter (Signed)
Plan called for status on appeal- ins states they did not receive appeal paperwork- verified the fax number that we sent it to. Ins rep stated that after 60 days a PA could be resubmitted and to attach chart notes on previous trials and failures. Going ahead with the resubmission.

## 2023-05-15 ENCOUNTER — Other Ambulatory Visit: Payer: Self-pay | Admitting: Internal Medicine

## 2023-05-15 NOTE — Telephone Encounter (Signed)
Please advise Sir, thank you.

## 2023-06-17 ENCOUNTER — Other Ambulatory Visit: Payer: Self-pay | Admitting: Internal Medicine

## 2023-06-17 NOTE — Telephone Encounter (Signed)
 Refill sent x 4  Haydon needs a visit with me or Hyacinth Meeker (she knows him also)

## 2023-06-17 NOTE — Telephone Encounter (Signed)
 Please advise Sir, thank you.

## 2023-06-18 NOTE — Telephone Encounter (Signed)
 I spoke to [facility] [phone]) one of his care takers. Booked Vonn an appointment with Dr Leone Payor. I told him we refilled his meds.

## 2023-09-04 ENCOUNTER — Encounter: Payer: Self-pay | Admitting: Internal Medicine

## 2023-09-04 ENCOUNTER — Ambulatory Visit (INDEPENDENT_AMBULATORY_CARE_PROVIDER_SITE_OTHER): Payer: MEDICAID | Admitting: Internal Medicine

## 2023-09-04 VITALS — BP 150/88 | HR 84

## 2023-09-04 DIAGNOSIS — K52832 Lymphocytic colitis: Secondary | ICD-10-CM | POA: Diagnosis not present

## 2023-09-04 DIAGNOSIS — J392 Other diseases of pharynx: Secondary | ICD-10-CM

## 2023-09-04 DIAGNOSIS — R1313 Dysphagia, pharyngeal phase: Secondary | ICD-10-CM

## 2023-09-04 DIAGNOSIS — K580 Irritable bowel syndrome with diarrhea: Secondary | ICD-10-CM

## 2023-09-04 MED ORDER — LOPERAMIDE HCL 2 MG PO CAPS: 2.0000 mg | ORAL_CAPSULE | Freq: Two times a day (BID) | ORAL | 11 refills | Status: AC

## 2023-09-04 MED ORDER — BUDESONIDE 3 MG PO CPEP: 9.0000 mg | ORAL_CAPSULE | Freq: Every morning | ORAL | 11 refills | Status: AC

## 2023-09-04 MED ORDER — VIBERZI 75 MG PO TABS
1.0000 | ORAL_TABLET | Freq: Two times a day (BID) | ORAL | 5 refills | Status: AC
Start: 2023-09-04 — End: ?

## 2023-09-04 MED ORDER — MESALAMINE ER 0.375 G PO CP24: 1.5000 g | ORAL_CAPSULE | Freq: Every day | ORAL | 11 refills | Status: AC

## 2023-09-04 NOTE — Patient Instructions (Signed)
 Dr Willy Harvest is going to check with speech pathology about your gag reflex issue.  We have sent  medications to your pharmacy for you to pick up at your convenience.  Great to see you today!!  I appreciate the opportunity to care for you. Loy Ruff, MD, [facility]

## 2023-09-04 NOTE — Progress Notes (Signed)
 Alan Benson 48 y.o. Jan 02, 1976 [id number]  Assessment & Plan:   Encounter Diagnoses  Name Primary?   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     He is colitis and IBS are under control and I refilled his medications.  He can return in a year for this. He had a colonoscopy in 2014, and technically he is a little overdue for a repeat.  However it is a monumental task for him to have a colonoscopy, he was admitted to the hospital when we did that in the past.  I am not inclined to go that route for screening at this point.  This can be revisited upon follow-up.    Regarding this gag reflex issue and a question of pharyngeal dysphagia I am going to reach out to speech pathology to see how they might help.  I do not think he could do a standard modified barium swallow.  He may need to see his neurologist.  Question if this is related to his spastic cerebral palsy.  I think that would most likely affect skeletal muscle and not smooth muscle issues though there is overlap in this region.     Meds ordered this encounter  Medications   Eluxadoline  (VIBERZI ) 75 MG TABS    Sig: Take 1 tablet (75 mg total) by mouth 2 (two) times daily.    Dispense:  60 tablet    Refill:  5    Prescription Expired..need refills for his packs   mesalamine  (APRISO ) 0.375 g 24 hr capsule    Sig: Take 4 capsules (1.5 g total) by mouth daily.    Dispense:  112 capsule    Refill:  11    need refills for his packs   budesonide  (ENTOCORT EC ) 3 MG 24 hr capsule    Sig: Take 3 capsules (9 mg total) by mouth every morning.    Dispense:  84 capsule    Refill:  11    need refills for his packs   loperamide  (IMODIUM ) 2 MG capsule    Sig: Take 1 capsule (2 mg total) by mouth 2 (two) times daily.    Dispense:  56 capsule    Refill:  11    need refills for his packs       Subjective:   Chief Complaint: Follow-up of  lymphocytic colitis, also complaining of an increased gag reflex.  HPI 48 year old man with spastic cerebral palsy and chronic lymphocytic colitis/IBS issues treated with budesonide , mesalamine  and Viberzi  and Imodium  chronically.  Caregiver accompanies him.  Alan Benson is not having any problems with diarrhea or constipation.  He would like refills on his medications.  He has a new complaint today of increased gag reflex.  He says in the mornings he will gag with his medications and breakfast and spit things up at times.  I do not think he is having dysphagia.  He denies globus, anxiety.  There is not chronic nausea.  No heartburn.  The rest of the day he is fine with swallowing pills and food.  He says this has been going on for about a year. Allergies  Allergen Reactions   Penicillins     REACTION: Hives   Current Meds  Medication Sig   baclofen  (LIORESAL ) 20 MG tablet TAKE 1 TABLET BY MOUTH FOUR TIMES A DAY   budesonide  (ENTOCORT EC ) 3 MG 24 hr capsule TAKE 3 CAPSULES BY  MOUTH EVERY MORNING   chlorhexidine  (PERIDEX ) 0.12 % solution RINSE & SPIT 15 MLS BY MOUTH EVERY IN THE MORNING   diazepam  (VALIUM ) 2 MG tablet TAKE 1 TABLET BY MOUTH EVERY TWELVE HOURS AS NEEDED FOR ANXIETY   Eluxadoline  (VIBERZI ) 75 MG TABS TAKE 1 TABLET BY MOUTH TWICE A DAY   loperamide  (IMODIUM ) 2 MG capsule TAKE 1 CAPSULE BY MOUTH TWICE A DAY   mesalamine  (APRISO ) 0.375 g 24 hr capsule TAKE 4 CAPSULES BY MOUTH DAILY   Nutritional Supplements (ENSURE ORIGINAL) LIQD Take 1 Can by mouth daily.   ondansetron  (ZOFRAN ) 8 MG tablet TAKE 1 TABLET BY MOUTH EVERY MORNING AS NEEDED   risperiDONE (RISPERDAL M-TABS) 0.5 MG disintegrating tablet Take 0.5 mg by mouth at bedtime.   Vitamins A & D (VITAMIN A & D) OINT Apply 1 application topically in the morning and at bedtime.   Past Medical History:  Diagnosis Date   Cerebral palsy (HCC)    CNS disorder    Diarrhea    Failure to thrive in childhood    GERD (gastroesophageal  reflux disease) 02/11/2013   Lymphocytic colitis 12/21/2004   Mixed spastic athetoid cerebral palsy (HCC)    Muscle spasms of head and/or neck    Severe flexion contractures of all joints    Varicose vein    Past Surgical History:  Procedure Laterality Date   COLONOSCOPY  2006   COLONOSCOPY WITH PROPOFOL  N/A 08/12/2012   Procedure: COLONOSCOPY WITH PROPOFOL ;  Surgeon: Kenney Peacemaker, MD;  [facility];  Service: Endoscopy;  Laterality: N/A;   ESOPHAGOGASTRODUODENOSCOPY (EGD) WITH PROPOFOL  N/A 08/12/2012   Procedure: ESOPHAGOGASTRODUODENOSCOPY (EGD) WITH PROPOFOL ;  Surgeon: Kenney Peacemaker, MD;  [facility];  Service: Endoscopy;  Laterality: N/A;   Social History   Social History Narrative   Single, disabled, lives in an apartment with help, full-time assistance.   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   Review of Systems As per HPI  Objective:   Physical Exam BP (!) 150/88 (BP Location: Right Arm, Patient Position: Sitting, Cuff Size: Normal)   Pulse 49  Middle-age man with severe spastic cerebral palsy in a motorized chair. Tongue appears midline without fasciculations and he is able to protrude it and move it sideways.  Uvula is midline.  It does point to the left side slightly but it overall appears midline, it rises straight up and the palatal folds rise symmetrically when patient says off. Neck is soft without masses or lymphadenopathy. Lungs are clear. Heart S1-S2 no rubs murmurs or gallops Abdomen is soft and nontender

## 2024-02-24 ENCOUNTER — Telehealth: Payer: Self-pay

## 2024-02-24 NOTE — Telephone Encounter (Signed)
 Pharmacy Patient Advocate Encounter   Received notification from CoverMyMeds that prior authorization for Viberzi  and Mesalamine  is required/requested.   Insurance verification completed.   The patient is insured through [place] MEDICAID.   Per test claim: xxx

## 2024-02-24 NOTE — Telephone Encounter (Signed)
 Left message on machine to call back

## 2024-02-25 ENCOUNTER — Other Ambulatory Visit (HOSPITAL_COMMUNITY): Payer: Self-pay

## 2024-02-25 ENCOUNTER — Telehealth: Payer: Self-pay

## 2024-02-25 NOTE — Telephone Encounter (Signed)
 Patient requesting a call back from [facility] regarding previous notes. Please advise, thank you

## 2024-02-25 NOTE — Telephone Encounter (Signed)
 Left message on machine to call back

## 2024-02-25 NOTE — Telephone Encounter (Signed)
 Pharmacy Patient Advocate Encounter   Received notification from Pt Calls Messages that prior authorization for Viberzi  75MG  tablets is required/requested.   Insurance verification completed.   The patient is insured through [facility].   Per test claim: PA required; PA submitted to above mentioned insurance via Latent Key/confirmation #/EOC [id number] Status is pending

## 2024-02-25 NOTE — Telephone Encounter (Signed)
 Patient returned call and reports that he is currently taking Viberzi  but is not taking mesalamine .

## 2024-02-25 NOTE — Telephone Encounter (Signed)
 Pharmacy Patient Advocate Encounter  Received notification from [facility] that Prior Authorization for Viberzi  75MG  tablets  has been APPROVED from 02-25-2024 to 02-24-2025. Ran test claim, Copay is $0.00. This test claim was processed through [facility]- copay amounts may vary at other pharmacies due to pharmacy/plan contracts, or as the patient moves through the different stages of their insurance plan.   PA #/Case ID/Reference #: [id number]

## 2024-02-25 NOTE — Telephone Encounter (Signed)
 See message regarding mesalamine 

## 2024-02-25 NOTE — Telephone Encounter (Signed)
 Noted pt. advised

## 2024-02-26 ENCOUNTER — Other Ambulatory Visit (HOSPITAL_COMMUNITY): Payer: Self-pay

## 2024-02-26 ENCOUNTER — Telehealth: Payer: Self-pay

## 2024-02-26 NOTE — Telephone Encounter (Signed)
 Pharmacy Patient Advocate Encounter  Received notification from [facility] that Prior Authorization for Mesalamine  ER 0.375GM er capsules has been APPROVED from 02-26-2024 to 02-25-2025. Ran test claim, Copay is $0.00. This test claim was processed through [facility]- copay amounts may vary at other pharmacies due to pharmacy/plan contracts, or as the patient moves through the different stages of their insurance plan.   PA #/Case ID/Reference #: [id number]

## 2024-02-26 NOTE — Telephone Encounter (Signed)
 Pharmacy Patient Advocate Encounter   Received notification from Pt Calls Messages that prior authorization for Mesalamine  ER 0.375GM er capsules is required/requested.   Insurance verification completed.   The patient is insured through [facility].   Per test claim: PA required; PA started via CoverMyMeds. KEY [id number] . Waiting for clinical questions to populate.

## 2024-02-26 NOTE — Telephone Encounter (Signed)
 Pharmacy Patient Advocate Encounter    Per test claim: PA required; PA submitted to above mentioned insurance via Latent Key/confirmation #/EOC [id number] Status is pending

## 2024-02-26 NOTE — Telephone Encounter (Signed)
 Noted pt. advised

## 2024-03-25 ENCOUNTER — Other Ambulatory Visit: Payer: Self-pay | Admitting: Internal Medicine

## 2024-03-25 NOTE — Telephone Encounter (Signed)
 Rx sent Please schedule Deny to see me Jan or Feb 26

## 2024-03-26 NOTE — Telephone Encounter (Signed)
 I have left a detailed message for his residential provider Morton Kea at [phone] to call us  and set up a January or February appointment with Dr Avram. I told him we sent in the Viberzi  refill.

## 2024-03-30 ENCOUNTER — Telehealth: Payer: Self-pay | Admitting: Gastroenterology

## 2024-03-30 NOTE — Telephone Encounter (Signed)
 Received a call from the answering service from 4:34 PM. Patient wants to reschedule his upcoming appointment. I tried calling both of the numbers but could not reach him to let them know that the office staff would reach out to him on 12/17. I am leaving this in documentation and we will forward it to Dr. Avram as well.   Alan Finner, MD [facility] Office # [phone]

## 2024-03-31 NOTE — Telephone Encounter (Signed)
 Message sent to the pt to call and reschedule as he is able on My Chart - he does view his messages

## 2024-04-05 NOTE — Telephone Encounter (Signed)
 Tito has an appointment 06/01/2024 with Dr Avram.

## 2024-04-15 DEATH — deceased

## 2024-05-19 ENCOUNTER — Ambulatory Visit: Payer: MEDICAID | Admitting: Internal Medicine

## 2024-06-01 ENCOUNTER — Ambulatory Visit: Payer: MEDICAID | Admitting: Internal Medicine
